# Patient Record
Sex: Male | Born: 1965 | Race: White | Hispanic: No | Marital: Married | Smoking: Never smoker
Health system: Southern US, Community
[De-identification: ages and names within clinical notes are randomized; demographics above are authoritative.]

## PROBLEM LIST (undated history)

## (undated) DIAGNOSIS — E079 Disorder of thyroid, unspecified: Secondary | ICD-10-CM

## (undated) DIAGNOSIS — J189 Pneumonia, unspecified organism: Secondary | ICD-10-CM

## (undated) DIAGNOSIS — E785 Hyperlipidemia, unspecified: Secondary | ICD-10-CM

## (undated) DIAGNOSIS — E039 Hypothyroidism, unspecified: Secondary | ICD-10-CM

## (undated) HISTORY — PX: WISDOM TOOTH EXTRACTION: SHX21

## (undated) HISTORY — PX: FINE NEEDLE ASPIRATION: SHX406

## (undated) HISTORY — PX: BACK SURGERY: SHX140

---
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
## 2001-01-05 ENCOUNTER — Encounter: Payer: Self-pay | Admitting: Emergency Medicine

## 2001-01-05 ENCOUNTER — Emergency Department (HOSPITAL_COMMUNITY): Admission: EM | Admit: 2001-01-05 | Discharge: 2001-01-05 | Payer: Self-pay | Admitting: Emergency Medicine

## 2005-06-10 ENCOUNTER — Encounter: Admission: RE | Admit: 2005-06-10 | Discharge: 2005-06-10 | Payer: Self-pay | Admitting: Family Medicine

## 2009-11-08 ENCOUNTER — Emergency Department (HOSPITAL_COMMUNITY): Admission: EM | Admit: 2009-11-08 | Discharge: 2009-11-08 | Payer: Self-pay | Admitting: Emergency Medicine

## 2015-05-15 DIAGNOSIS — J189 Pneumonia, unspecified organism: Secondary | ICD-10-CM

## 2015-05-15 HISTORY — DX: Pneumonia, unspecified organism: J18.9

## 2015-05-24 ENCOUNTER — Ambulatory Visit
Admission: RE | Admit: 2015-05-24 | Discharge: 2015-05-24 | Disposition: A | Discharge status: Home / Self Care | Payer: BLUE CROSS/BLUE SHIELD | Source: Ambulatory Visit | Attending: Family Medicine | Admitting: Family Medicine

## 2015-05-24 ENCOUNTER — Other Ambulatory Visit: Payer: Self-pay | Admitting: Family Medicine

## 2015-05-24 DIAGNOSIS — R509 Fever, unspecified: Secondary | ICD-10-CM

## 2015-05-24 DIAGNOSIS — R05 Cough: Secondary | ICD-10-CM

## 2015-05-24 DIAGNOSIS — R059 Cough, unspecified: Secondary | ICD-10-CM

## 2015-05-24 DIAGNOSIS — R0602 Shortness of breath: Secondary | ICD-10-CM

## 2015-05-25 ENCOUNTER — Other Ambulatory Visit: Payer: Self-pay | Admitting: Family Medicine

## 2015-05-25 DIAGNOSIS — E049 Nontoxic goiter, unspecified: Secondary | ICD-10-CM

## 2015-05-30 ENCOUNTER — Ambulatory Visit
Admission: RE | Admit: 2015-05-30 | Discharge: 2015-05-30 | Disposition: A | Discharge status: Home / Self Care | Payer: BLUE CROSS/BLUE SHIELD | Source: Ambulatory Visit | Attending: Family Medicine | Admitting: Family Medicine

## 2015-05-30 DIAGNOSIS — E049 Nontoxic goiter, unspecified: Secondary | ICD-10-CM

## 2015-06-14 ENCOUNTER — Other Ambulatory Visit: Payer: Self-pay | Admitting: Family Medicine

## 2015-06-14 ENCOUNTER — Ambulatory Visit
Admission: RE | Admit: 2015-06-14 | Discharge: 2015-06-14 | Disposition: A | Discharge status: Home / Self Care | Payer: BLUE CROSS/BLUE SHIELD | Source: Ambulatory Visit | Attending: Family Medicine | Admitting: Family Medicine

## 2015-06-14 DIAGNOSIS — Z09 Encounter for follow-up examination after completed treatment for conditions other than malignant neoplasm: Secondary | ICD-10-CM

## 2015-06-22 ENCOUNTER — Other Ambulatory Visit (HOSPITAL_COMMUNITY): Payer: Self-pay | Admitting: Surgery

## 2015-06-22 DIAGNOSIS — E042 Nontoxic multinodular goiter: Secondary | ICD-10-CM

## 2015-06-29 ENCOUNTER — Ambulatory Visit (HOSPITAL_COMMUNITY)
Admission: RE | Admit: 2015-06-29 | Discharge: 2015-06-29 | Disposition: A | Discharge status: Home / Self Care | Payer: BLUE CROSS/BLUE SHIELD | Source: Ambulatory Visit | Attending: Surgery | Admitting: Surgery

## 2015-06-29 DIAGNOSIS — E041 Nontoxic single thyroid nodule: Secondary | ICD-10-CM | POA: Diagnosis not present

## 2015-06-29 DIAGNOSIS — E042 Nontoxic multinodular goiter: Secondary | ICD-10-CM

## 2015-06-29 NOTE — Procedures (Signed)
Successful FNA x 4 of isthmus thyroid nodule. No immediate complications, dressing placed. Tsosie Billing PA-C Interventional Radiology  06/29/15  1:43 PM

## 2015-07-13 ENCOUNTER — Other Ambulatory Visit: Payer: Self-pay | Admitting: Surgery

## 2015-07-13 DIAGNOSIS — E042 Nontoxic multinodular goiter: Secondary | ICD-10-CM

## 2015-07-20 ENCOUNTER — Ambulatory Visit
Admission: RE | Admit: 2015-07-20 | Discharge: 2015-07-20 | Disposition: A | Discharge status: Home / Self Care | Payer: BLUE CROSS/BLUE SHIELD | Source: Ambulatory Visit | Attending: Surgery | Admitting: Surgery

## 2015-07-20 DIAGNOSIS — E042 Nontoxic multinodular goiter: Secondary | ICD-10-CM

## 2015-07-20 MED ORDER — IOPAMIDOL (ISOVUE-300) INJECTION 61%
100.0000 mL | Freq: Once | INTRAVENOUS | Status: AC | PRN
  Administered 2015-07-20: 100 mL via INTRAVENOUS

## 2015-07-27 ENCOUNTER — Ambulatory Visit: Payer: Self-pay | Admitting: Surgery

## 2015-08-08 NOTE — Progress Notes (Addendum)
Anesthesia PAT Evaluation: Patient is a 50 year old male scheduled for total thyroidectomy on 08/21/15 (first case) by Dr. Harlow Asa.  Anesthesia consult requested due to goiter with right tracheal deviation. CT scan reviewed with multiple anesthesiologists including Dr. Lissa Hoard. Images reviewed with CT surgery along side Dr. Harlow Asa. Decision was made for CT surgery to be available for this procedure including if rigid bronchoscopy was needed to establish an airway and also if a partial median sternotomy is needed for exposure to remove the entire thyroid. Patient was seen by CT surgeon Dr. Servando Snare while at his PAT visit (see his note).  History includes non-smoker, thyroid goiter, PNA 05/2015, back surgery, dyslipidemia. BMI 34.61. PCP is Dr. Jonathon Jordan.    Meds include ASA 81 mg (on hold), MVI, Zocor.  PAT Vitals: BP 152/92, HR 81, RR 20, T 36.7C, O2 sat 96%. Heart RRR, no murmur noted. Lungs overall clear, somewhat course sounding throughout but no wheezing or rhonchi noted. No ankle edema. No carotid bruits noted. Good mouth opening. Reports crown on right, back upper tooth. Neck is full with thyromegaly, worse on left side.   08/13/15 EKG: NSR, cannot rule out anterior infarct (age undetermined). Low r wave (no Q wave) is new when compared to 11/08/09. He denied chest pain, SOB at rest. He does have exertional dyspnea. He can lie flat but has noticed that he feels more SOB flat. He does not exercise on a regular basis, but is able to do his day to day activities without any CV symptoms. Activity has been more limited since his PNA in 05/2015. He says that within the past six months he was able to do yard work and/or walk up 1-2 flights of stairs without chest pain symptoms. He denied palpitations, syncope, edema.   07/20/15 Neck & Chest CT w/ contrast: FINDINGS: CT NECK FINDINGS - Pharynx is normal with no mucosal lesions. Larynx is normal. Major salivary glands are symmetric and normal  bilaterally. No significant abnormality involving either carotid artery, with minimal focus of calcification on the left. No significant cervical adenopathy. - Known large left thyroid mass measuring 6.8 x 5.1 by 9.5 cm with heterogeneous enhancement and extensive calcification. Mild nodularity and heterogeneity of the isthmus and right thyroid. - Esophagus and thyroid both significantly deviated to the right and narrowed due to mass effect from the left thyroid mass. CT CHEST FINDINGS - Enlarged heterogeneous thyroid mass on the left extends well into the upper mediastinum, terminating posterior to the aortic arch. No significant hilar or mediastinal adenopathy. Largest lymph node measures 9 mm in the precarinal region and maintains a normal hilum. - Normal heart size with mild coronary calcification. No pleural or pericardial effusion. No significant pulmonary parenchymal abnormalities. No acute musculoskeletal findings. - Images through the upper abdomen demonstrate moderate diffuse hepatic steatosis. The liver is not completely imaged. The adrenal glands are not completely imaged but to the degree that the arc imaged appear normal. Very small hiatal hernia noted. IMPRESSION:  Large left thyroid mass (6.8 X 5.1 X 9.5 cm) as described above causing significant mass effect on the trachea and esophagus with right word deviation and narrowing. Thyroid mass is suspicious for carcinoma and is status post biopsy. (FNA 07/02/15: Findings consistent with a benign thyroid nodule.)  06/14/15 CXR: IMPRESSION: 1. Interim near complete clearing of right lower lobe infiltrate. 2. Trachea shifted to the right of midline consistent with previously identified goiter.  Pre-operative labs noted. CMET, PT/PTT, T&S added due to potential for  CT surgery involvement. 06/11/15 thyroid studies (PCP) showed Free T4 0.74, TSH 0.31, thyroglobulin antibody < 1.0, thyroid peroxidase (TPO) Ab 7.   If no acute changes then I  anticipate that he can proceed as planned. Dr. Servando Snare to be available as discussed above.   George Hugh [facility] Short Stay Center/Anesthesiology Phone [phone] 08/13/2015 5:23 PM

## 2015-08-13 ENCOUNTER — Encounter (HOSPITAL_COMMUNITY)
Admission: RE | Admit: 2015-08-13 | Discharge: 2015-08-13 | Disposition: A | Discharge status: Home / Self Care | Payer: BLUE CROSS/BLUE SHIELD | Source: Ambulatory Visit | Attending: Surgery | Admitting: Surgery

## 2015-08-13 ENCOUNTER — Encounter (HOSPITAL_COMMUNITY): Payer: Self-pay

## 2015-08-13 DIAGNOSIS — Z79899 Other long term (current) drug therapy: Secondary | ICD-10-CM | POA: Diagnosis not present

## 2015-08-13 DIAGNOSIS — J398 Other specified diseases of upper respiratory tract: Secondary | ICD-10-CM | POA: Insufficient documentation

## 2015-08-13 DIAGNOSIS — Z0183 Encounter for blood typing: Secondary | ICD-10-CM | POA: Diagnosis not present

## 2015-08-13 DIAGNOSIS — R0602 Shortness of breath: Secondary | ICD-10-CM | POA: Insufficient documentation

## 2015-08-13 DIAGNOSIS — E785 Hyperlipidemia, unspecified: Secondary | ICD-10-CM | POA: Insufficient documentation

## 2015-08-13 DIAGNOSIS — Z01818 Encounter for other preprocedural examination: Secondary | ICD-10-CM | POA: Diagnosis not present

## 2015-08-13 DIAGNOSIS — E049 Nontoxic goiter, unspecified: Secondary | ICD-10-CM | POA: Diagnosis not present

## 2015-08-13 DIAGNOSIS — Z01812 Encounter for preprocedural laboratory examination: Secondary | ICD-10-CM | POA: Diagnosis not present

## 2015-08-13 DIAGNOSIS — E034 Atrophy of thyroid (acquired): Secondary | ICD-10-CM | POA: Diagnosis not present

## 2015-08-13 HISTORY — DX: Pneumonia, unspecified organism: J18.9

## 2015-08-13 LAB — COMPREHENSIVE METABOLIC PANEL
ALT: 32 U/L (ref 17–63)
ANION GAP: 11 (ref 5–15)
AST: 29 U/L (ref 15–41)
Albumin: 4.3 g/dL (ref 3.5–5.0)
Alkaline Phosphatase: 51 U/L (ref 38–126)
BUN: 10 mg/dL (ref 6–20)
CHLORIDE: 107 mmol/L (ref 101–111)
CO2: 24 mmol/L (ref 22–32)
Calcium: 9.7 mg/dL (ref 8.9–10.3)
Creatinine, Ser: 0.91 mg/dL (ref 0.61–1.24)
GFR calc non Af Amer: >60 mL/min (ref >60)
Glucose, Bld: 89 mg/dL (ref 65–99)
Potassium: 4.1 mmol/L (ref 3.5–5.1)
SODIUM: 142 mmol/L (ref 135–145)
Total Bilirubin: 0.5 mg/dL (ref 0.3–1.2)
Total Protein: 7 g/dL (ref 6.5–8.1)

## 2015-08-13 LAB — CBC
HEMATOCRIT: 42.5 % (ref 39.0–52.0)
Hemoglobin: 14.4 g/dL (ref 13.0–17.0)
MCH: 29 pg (ref 26.0–34.0)
MCHC: 33.9 g/dL (ref 30.0–36.0)
MCV: 85.5 fL (ref 78.0–100.0)
PLATELETS: 208 10*3/uL (ref 150–400)
RBC: 4.97 MIL/uL (ref 4.22–5.81)
RDW: 13 % (ref 11.5–15.5)
WBC: 5.1 10*3/uL (ref 4.0–10.5)

## 2015-08-13 LAB — PROTIME-INR
INR: 0.98 (ref 0.00–1.49)
PROTHROMBIN TIME: 13.2 s (ref 11.6–15.2)

## 2015-08-13 LAB — ABO/RH: ABO/RH(D): A POS

## 2015-08-13 LAB — APTT: APTT: 29 s (ref 24–37)

## 2015-08-13 NOTE — Progress Notes (Signed)
Wayne Delacruz was called to evaluate. (patient has dev trachea).

## 2015-08-13 NOTE — Anesthesia Preprocedure Evaluation (Addendum)
Anesthesia Evaluation  Patient identified by MRN, date of birth, ID band Patient awake    Reviewed: Allergy & Precautions, H&P , NPO status , Patient's Chart, lab work & pertinent test results  Airway Mallampati: III  TM Distance: >3 FB Neck ROM: Full    Dental no notable dental hx. (+) Teeth Intact, Dental Advisory Given   Pulmonary shortness of breath and with exertion,    Pulmonary exam normal breath sounds clear to auscultation       Cardiovascular negative cardio ROS   Rhythm:Regular Rate:Normal     Neuro/Psych negative neurological ROS  negative psych ROS   GI/Hepatic negative GI ROS, Neg liver ROS,   Endo/Other  Thyroid mass with L>R tracheal deviation  Renal/GU negative Renal ROS  negative genitourinary   Musculoskeletal   Abdominal   Peds  Hematology negative hematology ROS (+)   Anesthesia Other Findings   Reproductive/Obstetrics negative OB ROS                            Anesthesia Physical Anesthesia Plan  ASA: III  Anesthesia Plan: General   Post-op Pain Management:    Induction: Intravenous  Airway Management Planned: Oral ETT, Video Laryngoscope Planned, Fiberoptic Intubation Planned and Awake Intubation Planned  Additional Equipment:   Intra-op Plan:   Post-operative Plan: Extubation in OR  Informed Consent: I have reviewed the patients History and Physical, chart, labs and discussed the procedure including the risks, benefits and alternatives for the proposed anesthesia with the patient or authorized representative who has indicated his/her understanding and acceptance.   Dental advisory given  Plan Discussed with: CRNA  Anesthesia Plan Comments: (See my anesthesia and CT surgery note. Myra Gianotti, PA-C)       Anesthesia Quick Evaluation

## 2015-08-13 NOTE — Pre-Procedure Instructions (Signed)
AHRON DEGNER  08/13/2015      [id number] [place], [place] [facility] [place] [address] [place] [address] Phone: [phone] Fax: [phone]    Your procedure is scheduled on Tuesday  08/21/15  Report to [facility] Admitting at 530 A.M.  Call this number if you have problems the morning of surgery:  [phone]   Remember:  Do not eat food or drink liquids after midnight.  Take these medicines the morning of surgery with A SIP OF WATER  NONE      (STOP ASPIRIN, MULTIVITAMIN, HERBAL MEDICINES, IBUPROFEN/ ADVIL/ MOTRIN, GOODY POWDERS/ BC'S)   Do not wear jewelry, make-up or nail polish.  Do not wear lotions, powders, or perfumes.  You may wear deodorant.  Do not shave 48 hours prior to surgery.  Men may shave face and neck.  Do not bring valuables to the hospital.  [facility] is not responsible for any belongings or valuables.  Contacts, dentures or bridgework may not be worn into surgery.  Leave your suitcase in the car.  After surgery it may be brought to your room.  For patients admitted to the hospital, discharge time will be determined by your treatment team.  Patients discharged the day of surgery will not be allowed to drive home.   Name and phone number of your driver:    Special instructions:  [place] - Preparing for Surgery  Before surgery, you can play an important role.  Because skin is not sterile, your skin needs to be as free of germs as possible.  You can reduce the number of germs on you skin by washing with CHG (chlorahexidine gluconate) soap before surgery.  CHG is an antiseptic cleaner which kills germs and bonds with the skin to continue killing germs even after washing.  Please DO NOT use if you have an allergy to CHG or antibacterial soaps.  If your skin becomes reddened/irritated stop using the CHG and inform your nurse when you arrive at Short Stay.  Do not shave (including legs and underarms) for at least 48 hours  prior to the first CHG shower.  You may shave your face.  Please follow these instructions carefully:   1.  Shower with CHG Soap the night before surgery and the                                morning of Surgery.  2.  If you choose to wash your hair, wash your hair first as usual with your       normal shampoo.  3.  After you shampoo, rinse your hair and body thoroughly to remove the                      Shampoo.  4.  Use CHG as you would any other liquid soap.  You can apply chg directly       to the skin and wash gently with scrungie or a clean washcloth.  5.  Apply the CHG Soap to your body ONLY FROM THE NECK DOWN.        Do not use on open wounds or open sores.  Avoid contact with your eyes,       ears, mouth and genitals (private parts).  Wash genitals (private parts)       with your normal soap.  6.  Wash thoroughly, paying special  attention to the area where your surgery        will be performed.  7.  Thoroughly rinse your body with warm water from the neck down.  8.  DO NOT shower/wash with your normal soap after using and rinsing off       the CHG Soap.  9.  Pat yourself dry with a clean towel.            10.  Wear clean pajamas.            11.  Place clean sheets on your bed the night of your first shower and do not        sleep with pets.  Day of Surgery  Do not apply any lotions/deoderants the morning of surgery.  Please wear clean clothes to the hospital/surgery center.    Please read over the following fact sheets that you were given. Pain Booklet, Coughing and Deep Breathing and Surgical Site Infection Prevention

## 2015-08-13 NOTE — Progress Notes (Signed)
Patient ID: IROH JESCHKE, male   DOB: 1965/11/08, 50 y.o.   MRN: [id number]      [address]       [place] [address]             [phone]                    Japheth L Dondero [place] Medical Record [id number] Date of Birth: Mar 15, 1966  Referring: Dr Harlow Asa  Primary Care: Lilian Coma, MD  Chief Complaint:   Enlarged Thyroid with tracheal deviation.   History of Present Illness:    CATALINO GURRY 50 y.o. male is seen in short stay  at request of Dr Harlow Asa  today for enlarged thyroid. The patient for the past 6 months had noted some gradual increasing shortness of breath with exertion. He noticed that he would get more short of breath when he got to the top of the stairs. In November 2016 he had some respiratory complaints thought to be pneumonia , chest x-ray was performed. The film revealed evidence of tracheal deviation to the right. Follow-up CT scan of the neck and chest confirmed a markedly enlarged left lobe of the thyroid with substernal extension. Needle biopsy was benign. The patient was seen by Dr. Harlow Asa, thyroidectomy was recommended. I was asked by Dr. Harlow Asa and anesthesia to see the patient and provide thoracic surgery backup at the time of surgery.    Patient's had no shortness of breath at rest, or laying flat. He's had no stridor or wheezing.  Current Activity/ Functional Status:  Patient is independent with mobility/ambulation, transfers, ADL's, IADL's.   Zubrod Score: At the time of surgery this patient's most appropriate activity status/level should be described as: []     0    Normal activity, no symptoms [x]     1    Restricted in physical strenuous activity but ambulatory, able to do out light work []     2    Ambulatory and capable of self care, unable to do work activities, up and about               >50 % of waking hours                              []     3    Only limited self care, in bed greater than 50% of waking hours []      4    Completely disabled, no self care, confined to bed or chair []     5    Moribund   Past Medical History  Diagnosis Date  . Pneumonia     11/16  . Thyroid goiter     mass     Past Surgical History  Procedure Laterality Date  . Back surgery      back  age 64  . Wisdom tooth extraction      History reviewed. No pertinent family history.  Social History   Social History  . Marital Status: Married    Spouse Name: N/A  . Number of Children: N/A  . Years of Education: N/A   Occupational History  . Not on file.   Social History Main Topics  . Smoking status: Never Smoker   . Smokeless tobacco: Not on file  . Alcohol Use: Yes     Comment: occ beer  . Drug Use: No  . Sexual Activity: Not on file  Other Topics Concern  . Not on file   Social History Narrative  . No narrative on file    History  Smoking status  . Never Smoker   Smokeless tobacco  . Not on file    History  Alcohol Use  . Yes    Comment: occ beer     Allergies  Allergen Reactions  . Amoxicillin   . Augmentin [Amoxicillin-Pot Clavulanate]     Current Outpatient Prescriptions  Medication Sig Dispense Refill  . aspirin 81 MG tablet Take 81 mg by mouth daily.    . Multiple Vitamin (MULTIVITAMIN WITH MINERALS) TABS tablet Take 1 tablet by mouth daily.    . simvastatin (ZOCOR) 40 MG tablet Take 40 mg by mouth daily.     No current facility-administered medications for this encounter.      Review of Systems:     Cardiac Review of Systems: Y or N  Chest Pain [   n ]  Resting SOB [   n] Exertional SOB  [ mild ]  Orthopnea [ n ]   Pedal Edema [  n ]    Palpitations [n  ] Syncope  [  n]   Presyncope [  n ]  General Review of Systems: [Y] = yes [  ]=no Constitional: recent weight change [  n];  Wt loss over the last 3 months [   ] anorexia [ n; fatigue [  ]; nausea [  ]; night sweats [  ]; fever [  ]; or chills [  ];          Dental: poor dentition[ n]; Last Dentist visit:   Eye :  blurred vision [  ]; diplopia [   ]; vision changes [  ];  Amaurosis fugax[  ]; Resp: cough [  ];  wheezing[  ];  hemoptysis[  ]; shortness of breath[  ]; paroxysmal nocturnal dyspnea[  ]; dyspnea on exertion[  ]; or orthopnea[  ];  GI:  gallstones[  ], vomiting[  ];  dysphagia[  ]; melena[  ];  hematochezia [  ]; heartburn[  ];   Hx of  Colonoscopy[  ]; GU: kidney stones [  ]; hematuria[  ];   dysuria [  ];  nocturia[  ];  history of     obstruction [  ]; urinary frequency [  ]             Skin: rash, swelling[  ];, hair loss[  ];  peripheral edema[  ];  or itching[  ]; Musculosketetal: myalgias[  ];  joint swelling[  ];  joint erythema[  ];  joint pain[  ];  back pain[  ];  Heme/Lymph: bruising[  ];  bleeding[  ];  anemia[  ];  Neuro: TIA[  ];  headaches[  ];  stroke[  ];  vertigo[  ];  seizures[  ];   paresthesias[  ];  difficulty walking[  ];  Psych:depression[  ]; anxiety[  ];  Endocrine: diabetes[ n;  thyroid dysfunction[as noted  ];  Immunizations: Flu up to date [  ]; Pneumococcal up to date [  ];  Other:  Physical Exam: BP 152/92 mmHg  Pulse 81  Temp(Src) 98.1 F (36.7 C)  Resp 20  Ht 6\' 2"  (1.88 m)  Wt 269 lb 11.2 oz (122.335 kg)  BMI 34.61 kg/m2  SpO2 96%  PHYSICAL EXAMINATION: General appearance: alert, cooperative, appears stated age and no distress Head: Normocephalic, without obvious abnormality, atraumatic Neck: no  adenopathy, no carotid bruit, no JVD, supple, symmetrical, trachea midline and thyroid: enlarged, nodular and on left  Lymph nodes: Cervical, supraclavicular, and axillary nodes normal. Resp: clear to auscultation bilaterally Back: symmetric, no curvature. ROM normal. No CVA tenderness. Cardio: regular rate and rhythm, S1, S2 normal, no murmur, click, rub or gallop GI: soft, non-tender; bowel sounds normal; no masses,  no organomegaly Extremities: extremities normal, atraumatic, no cyanosis or edema and Homans sign is negative, no sign of DVT Neurologic:  Grossly normal  Diagnostic Studies & Laboratory data:     Recent Radiology Findings:   Ct Soft Tissue Neck W Contrast  07/20/2015  CLINICAL DATA:  Multinodular thyroid, check for esophageal deviation, history of pneumonia november 2016 EXAM: CT NECK AND CHEST WITH CONTRAST TECHNIQUE: Multidetector CT imaging of the neck, chest and abdomen was performed using the standard protocol after the bolus administration of intravenous contrast. CONTRAST:  176mL ISOVUE-300 IOPAMIDOL (ISOVUE-300) INJECTION 61% COMPARISON:  06/29/2015, 06/14/2015 FINDINGS: CT NECK FINDINGS Pharynx is normal with no mucosal lesions. Larynx is normal. Major salivary glands are symmetric and normal bilaterally. No significant abnormality involving either carotid artery, with minimal focus of calcification on the left. No significant cervical adenopathy. Known large left thyroid mass measuring 6.8 x 5.1 by 9.5 cm with heterogeneous enhancement and extensive calcification. Mild nodularity and heterogeneity of the isthmus and right thyroid. Esophagus and thyroid both significantly deviated to the right and narrowed due to mass effect from the left thyroid mass. CT CHEST FINDINGS Enlarged heterogeneous thyroid mass on the left extends well into the upper mediastinum, terminating posterior to the aortic arch. No significant hilar or mediastinal adenopathy. Largest lymph node measures 9 mm in the precarinal region and maintains a normal hilum. Normal heart size with mild coronary calcification. No pleural or pericardial effusion. No significant pulmonary parenchymal abnormalities. No acute musculoskeletal findings. Images through the upper abdomen demonstrate moderate diffuse hepatic steatosis. The liver is not completely imaged. The adrenal glands are not completely imaged but to the degree that the arc imaged appear normal. Very small hiatal hernia noted. IMPRESSION: Large left thyroid mass as described above causing significant mass effect on the  trachea and esophagus with right word deviation and narrowing. Thyroid mass is suspicious for carcinoma and is status post biopsy. Electronically Signed   By: Skipper Cliche M.D.   On: 07/20/2015 15:25     I have independently reviewed the above radiologic studies.  Recent Lab Findings: Lab Results  Component Value Date   WBC 5.1 08/13/2015   HGB 14.4 08/13/2015   HCT 42.5 08/13/2015   PLT 208 08/13/2015      Assessment / Plan:   Enlarged left lobe of thyroid with substernal component and deviation of trachea to the right   I have discussed the case with Dr Harlow Asa  and will be present at time of surgery. I have discussed with the patient the rare but definite risk of blood vessel or recurrent nerve injury . The possibility of partial sternotomy was discussed .  I  spent 30 minutes counseling the patient face to face and 50% or more the  time was spent in counseling and coordination of care. The total time spent in the appointment was 40 minutes.  Grace Isaac MD      [address] [place] [address] Office [phone]   Beeper [phone]  08/13/2015 1:27 PM

## 2015-08-20 ENCOUNTER — Encounter (HOSPITAL_COMMUNITY): Payer: Self-pay | Admitting: Surgery

## 2015-08-20 DIAGNOSIS — D440 Neoplasm of uncertain behavior of thyroid gland: Secondary | ICD-10-CM | POA: Diagnosis present

## 2015-08-20 DIAGNOSIS — E049 Nontoxic goiter, unspecified: Secondary | ICD-10-CM | POA: Diagnosis present

## 2015-08-20 MED ORDER — CIPROFLOXACIN IN D5W 400 MG/200ML IV SOLN
400.0000 mg | INTRAVENOUS | Status: AC
  Administered 2015-08-21: 400 mg via INTRAVENOUS
  Filled 2015-08-20: qty 200

## 2015-08-20 NOTE — H&P (Signed)
General Surgery [facility]  Jeffrey L. Jerelene Wayne Delacruz DOB: March 07, 1966 Married / Language: English / Race: White Male  History of Present Illness  The patient is a 50 year old male who presents with a complaint of Enlarged thyroid.  Patient is referred by Dr. Jonathon Jordan for evaluation of multinodular thyroid goiter with tracheal deviation. Patient had an upper respiratory infection this fall which required chest x-ray. He was found on chest x-ray to have pneumonia as well as an incidental finding of significant tracheal deviation. Patient underwent thyroid ultrasound on May 30, 2015. This showed an enlarged right thyroid lobe measuring 5.8 cm which was heterogeneous and contained multiple nodules, the largest of which measured 1.3 cm. The left thyroid lobe was markedly enlarged at 8.6 x 5.0 x 5.4 cm. There were 2 dominant nodules present measuring 7.0 cm and 3.9 cm. Both were solid. In the thyroid isthmus was a nodule measuring 2.2 cm. Biopsy of the 3 dominant nodules was recommended. Patient denies any compressive symptoms. He denies dyspnea or dysphagia. He denies tremors or palpitations. TSH level is mildly suppressed at 0.31. Patient has never been on thyroid medication. He has had no prior head or neck surgery. There is a family history of goiter in the patient's maternal grandmother. The patient's mother does take thyroid medication. There is no family history of other endocrine neoplasms.   Other Problems Back Pain Diverticulosis Hypercholesterolemia  Past Surgical History Spinal Surgery - Lower Back  Diagnostic Studies History Colonoscopy never  Medication History Simvastatin (40MG  Tablet, Oral) Active. Promethazine-Codeine (6.25-10MG /5ML Syrup, Oral) Active. Mucinex Sinus-Max Congestion (5-325-200MG  Tablet, Oral) Active. Multiple Vitamin (Oral) Active. Aspirin (81MG  Tablet, Oral) Active. Excedrin Sinus Headache (5-325MG   Tablet, Oral) Active. Pepcid (20MG  Tablet, Oral) Active. Medications Reconciled  Social History Alcohol use Occasional alcohol use. Caffeine use Coffee. No drug use Tobacco use Never smoker.  Family History Heart Disease Father. Hypertension Father. Thyroid problems Mother.    Review of Systems General Present- Fatigue. Not Present- Appetite Loss, Chills, Fever, Night Sweats, Weight Gain and Weight Loss. Skin Not Present- Change in Wart/Mole, Dryness, Hives, Jaundice, New Lesions, Non-Healing Wounds, Rash and Ulcer. HEENT Present- Hoarseness, Nose Bleed, Ringing in the Ears, Seasonal Allergies and Wears glasses/contact lenses. Not Present- Earache, Hearing Loss, Oral Ulcers, Sinus Pain, Sore Throat, Visual Disturbances and Yellow Eyes. Respiratory Present- Snoring. Not Present- Bloody sputum, Chronic Cough, Difficulty Breathing and Wheezing. Breast Not Present- Breast Mass, Breast Pain, Nipple Discharge and Skin Changes. Cardiovascular Not Present- Chest Pain, Difficulty Breathing Lying Down, Leg Cramps, Palpitations, Rapid Heart Rate, Shortness of Breath and Swelling of Extremities. Gastrointestinal Not Present- Abdominal Pain, Bloating, Bloody Stool, Change in Bowel Habits, Chronic diarrhea, Constipation, Difficulty Swallowing, Excessive gas, Gets full quickly at meals, Hemorrhoids, Indigestion, Nausea, Rectal Pain and Vomiting. Male Genitourinary Not Present- Blood in Urine, Change in Urinary Stream, Frequency, Impotence, Nocturia, Painful Urination, Urgency and Urine Leakage. Musculoskeletal Not Present- Back Pain, Joint Pain, Joint Stiffness, Muscle Pain, Muscle Weakness and Swelling of Extremities. Neurological Not Present- Decreased Memory, Fainting, Headaches, Numbness, Seizures, Tingling, Tremor, Trouble walking and Weakness. Psychiatric Not Present- Anxiety, Bipolar, Change in Sleep Pattern, Depression, Fearful and Frequent crying. Endocrine Not Present- Cold  Intolerance, Excessive Hunger, Hair Changes, Heat Intolerance, Hot flashes and New Diabetes. Hematology Not Present- Easy Bruising, Excessive bleeding, Gland problems, HIV and Persistent Infections.  Vitals Weight: 265 lb Height: 74in Body Surface Area: 2.45 m Body Mass Index: 34.02 kg/m  Temp.: 97.63F  Pulse: 82 (Regular)  BP: 134/68 (Sitting, Left Arm, Standard)   Physical Exam  General - appears comfortable, no distress; not diaphorectic  HEENT - normocephalic; sclerae clear, gaze conjugate; mucous membranes moist, dentition good; voice normal  Neck - asymmetric on extension; no palpable anterior or posterior cervical adenopathy; no palpable nodules in the right thyroid lobe; left thyroid lobe is occupied by dominant mass which is smooth, mobile with swallowing, and nontender; it extends beneath the left clavicle. There is a palpable 2 cm nodule in the isthmus which is mobile, smooth, and nontender.  Chest - clear bilaterally without rhonchi, rales, or wheeze  Cor - regular rhythm with normal rate; no significant murmur  Ext - non-tender without significant edema or lymphedema  Neuro - grossly intact; no tremor    Assessment & Plan  MULTINODULAR THYROID (E04.2) . 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 Written literature on thyroid diseases provided to the patient to review at home.  I reviewed the ultrasound results and laboratory studies with the patient and his wife. We discussed 3 issues which we will need to sort out regarding his thyroid. The first issue is to rule out underlying malignancy. We will obtain an ultrasound-guided fine-needle aspiration biopsy of the 3 dominant nodules in the near future. I will contact him with those results. The second issue then is the anatomy of this enlarged goiter and whether or not it is causing tracheal compression.  We will need to obtain a CT scan of the neck and chest after we have the biopsy results. The final component will be to monitor his borderline hyperthyroidism. He may need a radioactive iodine scan.  CT scan of the neck shows a large 9.5 cm mass in the mediastinum worrisome for malignancy.  I have recommended proceeding with total thyroidectomy.  Patient is in agreement.  The risks and benefits of the procedure have been discussed at length with the patient.  The patient understands the proposed procedure, potential alternative treatments, and the course of recovery to be expected.  All of the patient's questions have been answered at this time.  The patient wishes to proceed with surgery.  Earnstine Regal, MD, [facility] Office: [phone]

## 2015-08-21 ENCOUNTER — Observation Stay (HOSPITAL_COMMUNITY)
Admission: RE | Admit: 2015-08-21 | Discharge: 2015-08-22 | Disposition: A | Discharge status: Home / Self Care | Payer: BLUE CROSS/BLUE SHIELD | Source: Ambulatory Visit | Attending: Surgery | Admitting: Surgery

## 2015-08-21 ENCOUNTER — Ambulatory Visit (HOSPITAL_COMMUNITY): Payer: BLUE CROSS/BLUE SHIELD | Admitting: Certified Registered Nurse Anesthetist

## 2015-08-21 ENCOUNTER — Encounter (HOSPITAL_COMMUNITY): Payer: Self-pay | Admitting: *Deleted

## 2015-08-21 ENCOUNTER — Encounter (HOSPITAL_COMMUNITY)
Admission: RE | Disposition: A | Discharge status: Home / Self Care | Payer: Self-pay | Source: Ambulatory Visit | Attending: Surgery

## 2015-08-21 ENCOUNTER — Ambulatory Visit (HOSPITAL_COMMUNITY): Payer: BLUE CROSS/BLUE SHIELD | Admitting: Vascular Surgery

## 2015-08-21 DIAGNOSIS — E049 Nontoxic goiter, unspecified: Secondary | ICD-10-CM | POA: Diagnosis not present

## 2015-08-21 DIAGNOSIS — E042 Nontoxic multinodular goiter: Secondary | ICD-10-CM | POA: Diagnosis not present

## 2015-08-21 DIAGNOSIS — D440 Neoplasm of uncertain behavior of thyroid gland: Secondary | ICD-10-CM | POA: Diagnosis present

## 2015-08-21 DIAGNOSIS — Z7982 Long term (current) use of aspirin: Secondary | ICD-10-CM | POA: Insufficient documentation

## 2015-08-21 DIAGNOSIS — E0590 Thyrotoxicosis, unspecified without thyrotoxic crisis or storm: Secondary | ICD-10-CM | POA: Diagnosis not present

## 2015-08-21 DIAGNOSIS — E7800 Pure hypercholesterolemia, unspecified: Secondary | ICD-10-CM | POA: Diagnosis not present

## 2015-08-21 DIAGNOSIS — J398 Other specified diseases of upper respiratory tract: Secondary | ICD-10-CM | POA: Diagnosis not present

## 2015-08-21 DIAGNOSIS — Z79899 Other long term (current) drug therapy: Secondary | ICD-10-CM | POA: Diagnosis not present

## 2015-08-21 HISTORY — PX: TOTAL THYROIDECTOMY: SHX2547

## 2015-08-21 HISTORY — DX: Hyperlipidemia, unspecified: E78.5

## 2015-08-21 HISTORY — DX: Disorder of thyroid, unspecified: E07.9

## 2015-08-21 HISTORY — PX: THYROIDECTOMY: SHX17

## 2015-08-21 HISTORY — DX: Hypothyroidism, unspecified: E03.9

## 2015-08-21 LAB — PREPARE RBC (CROSSMATCH)

## 2015-08-21 SURGERY — THYROIDECTOMY
Anesthesia: General | Site: Neck

## 2015-08-21 MED ORDER — HYDROMORPHONE HCL 1 MG/ML IJ SOLN
0.2500 mg | INTRAMUSCULAR | Status: DC | PRN
  Administered 2015-08-21 (×4): 0.5 mg via INTRAVENOUS

## 2015-08-21 MED ORDER — LACTATED RINGERS IV SOLN
INTRAVENOUS | Status: DC | PRN
  Administered 2015-08-21 (×2): via INTRAVENOUS

## 2015-08-21 MED ORDER — 0.9 % SODIUM CHLORIDE (POUR BTL) OPTIME
TOPICAL | Status: DC | PRN
  Administered 2015-08-21 (×2): 1000 mL

## 2015-08-21 MED ORDER — ROCURONIUM BROMIDE 50 MG/5ML IV SOLN
INTRAVENOUS | Status: AC
  Filled 2015-08-21: qty 1

## 2015-08-21 MED ORDER — ONDANSETRON 4 MG PO TBDP: 4.0000 mg | ORAL_TABLET | Freq: Four times a day (QID) | ORAL | Status: DC | PRN

## 2015-08-21 MED ORDER — FENTANYL CITRATE (PF) 100 MCG/2ML IJ SOLN
INTRAMUSCULAR | Status: DC | PRN
  Administered 2015-08-21: 50 ug via INTRAVENOUS
  Administered 2015-08-21: 100 ug via INTRAVENOUS
  Administered 2015-08-21: 50 ug via INTRAVENOUS

## 2015-08-21 MED ORDER — HYDROCODONE-ACETAMINOPHEN 5-325 MG PO TABS
1.0000 | ORAL_TABLET | ORAL | Status: DC | PRN
  Administered 2015-08-21 – 2015-08-22 (×4): 2 via ORAL
  Filled 2015-08-21 (×4): qty 2

## 2015-08-21 MED ORDER — KETAMINE HCL 10 MG/ML IJ SOLN
INTRAMUSCULAR | Status: DC | PRN
  Administered 2015-08-21 (×3): 10 mg via INTRAVENOUS

## 2015-08-21 MED ORDER — ONDANSETRON HCL 4 MG/2ML IJ SOLN
INTRAMUSCULAR | Status: AC
  Filled 2015-08-21: qty 2

## 2015-08-21 MED ORDER — PROPOFOL 10 MG/ML IV BOLUS
INTRAVENOUS | Status: DC | PRN
  Administered 2015-08-21: 100 mg via INTRAVENOUS

## 2015-08-21 MED ORDER — EPHEDRINE SULFATE 50 MG/ML IJ SOLN
INTRAMUSCULAR | Status: DC | PRN
  Administered 2015-08-21: 10 mg via INTRAVENOUS

## 2015-08-21 MED ORDER — HYDROMORPHONE HCL 1 MG/ML IJ SOLN
INTRAMUSCULAR | Status: AC
  Filled 2015-08-21: qty 1

## 2015-08-21 MED ORDER — HEMOSTATIC AGENTS (NO CHARGE) OPTIME
TOPICAL | Status: DC | PRN
  Administered 2015-08-21: 1 via TOPICAL

## 2015-08-21 MED ORDER — PROPOFOL 10 MG/ML IV BOLUS
INTRAVENOUS | Status: AC
  Filled 2015-08-21: qty 20

## 2015-08-21 MED ORDER — EPHEDRINE SULFATE 50 MG/ML IJ SOLN
INTRAMUSCULAR | Status: AC
  Filled 2015-08-21: qty 1

## 2015-08-21 MED ORDER — LIDOCAINE HCL (CARDIAC) 20 MG/ML IV SOLN
INTRAVENOUS | Status: DC | PRN
  Administered 2015-08-21: 50 mg via INTRAVENOUS

## 2015-08-21 MED ORDER — MIDAZOLAM HCL 2 MG/2ML IJ SOLN
INTRAMUSCULAR | Status: AC
  Filled 2015-08-21: qty 2

## 2015-08-21 MED ORDER — LIDOCAINE HCL 4 % EX SOLN
CUTANEOUS | Status: DC | PRN
  Administered 2015-08-21: 4 mL via TOPICAL

## 2015-08-21 MED ORDER — DEXMEDETOMIDINE HCL 200 MCG/2ML IV SOLN
INTRAVENOUS | Status: DC | PRN
  Administered 2015-08-21 (×5): 8 ug via INTRAVENOUS

## 2015-08-21 MED ORDER — MIDAZOLAM HCL 5 MG/5ML IJ SOLN
INTRAMUSCULAR | Status: DC | PRN
  Administered 2015-08-21 (×2): 1 mg via INTRAVENOUS

## 2015-08-21 MED ORDER — KCL IN DEXTROSE-NACL 20-5-0.45 MEQ/L-%-% IV SOLN
INTRAVENOUS | Status: DC
  Administered 2015-08-21: 13:00:00 via INTRAVENOUS
  Filled 2015-08-21: qty 1000

## 2015-08-21 MED ORDER — SUCCINYLCHOLINE CHLORIDE 20 MG/ML IJ SOLN
INTRAMUSCULAR | Status: AC
  Filled 2015-08-21: qty 1

## 2015-08-21 MED ORDER — DEXMEDETOMIDINE HCL IN NACL 200 MCG/50ML IV SOLN
INTRAVENOUS | Status: AC
  Filled 2015-08-21: qty 50

## 2015-08-21 MED ORDER — SUGAMMADEX SODIUM 200 MG/2ML IV SOLN
INTRAVENOUS | Status: DC | PRN
Start: 2015-08-21 — End: 2015-08-21
  Administered 2015-08-21: 200 mg via INTRAVENOUS

## 2015-08-21 MED ORDER — FENTANYL CITRATE (PF) 250 MCG/5ML IJ SOLN
INTRAMUSCULAR | Status: AC
  Filled 2015-08-21: qty 5

## 2015-08-21 MED ORDER — CALCIUM CARBONATE 1250 (500 CA) MG PO TABS
2.0000 | ORAL_TABLET | Freq: Three times a day (TID) | ORAL | Status: DC
  Administered 2015-08-21 – 2015-08-22 (×2): 1000 mg via ORAL
  Filled 2015-08-21 (×2): qty 1

## 2015-08-21 MED ORDER — KETAMINE HCL 100 MG/ML IJ SOLN
INTRAMUSCULAR | Status: AC
  Filled 2015-08-21: qty 1

## 2015-08-21 MED ORDER — LIDOCAINE HCL (CARDIAC) 20 MG/ML IV SOLN
INTRAVENOUS | Status: AC
  Filled 2015-08-21: qty 5

## 2015-08-21 MED ORDER — ONDANSETRON HCL 4 MG/2ML IJ SOLN: 4.0000 mg | Freq: Four times a day (QID) | INTRAMUSCULAR | Status: DC | PRN

## 2015-08-21 MED ORDER — SODIUM CHLORIDE 0.9 % IV SOLN: Freq: Once | INTRAVENOUS | Status: DC

## 2015-08-21 MED ORDER — ROCURONIUM BROMIDE 100 MG/10ML IV SOLN
INTRAVENOUS | Status: DC | PRN
  Administered 2015-08-21: 30 mg via INTRAVENOUS
  Administered 2015-08-21: 40 mg via INTRAVENOUS
  Administered 2015-08-21: 10 mg via INTRAVENOUS
  Administered 2015-08-21: 20 mg via INTRAVENOUS

## 2015-08-21 MED ORDER — DEXAMETHASONE SODIUM PHOSPHATE 10 MG/ML IJ SOLN
INTRAMUSCULAR | Status: DC | PRN
  Administered 2015-08-21: 10 mg via INTRAVENOUS

## 2015-08-21 MED ORDER — PHENYLEPHRINE 40 MCG/ML (10ML) SYRINGE FOR IV PUSH (FOR BLOOD PRESSURE SUPPORT)
PREFILLED_SYRINGE | INTRAVENOUS | Status: AC
  Filled 2015-08-21: qty 10

## 2015-08-21 MED ORDER — ONDANSETRON HCL 4 MG/2ML IJ SOLN
INTRAMUSCULAR | Status: DC | PRN
  Administered 2015-08-21: 4 mg via INTRAVENOUS

## 2015-08-21 MED ORDER — ACETAMINOPHEN 650 MG RE SUPP
650.0000 mg | Freq: Four times a day (QID) | RECTAL | Status: DC | PRN
Start: 2015-08-21 — End: 2015-08-22

## 2015-08-21 MED ORDER — HYDROMORPHONE HCL 1 MG/ML IJ SOLN
1.0000 mg | INTRAMUSCULAR | Status: DC | PRN
  Administered 2015-08-21: 1 mg via INTRAVENOUS
  Filled 2015-08-21: qty 1

## 2015-08-21 MED ORDER — GLYCOPYRROLATE 0.2 MG/ML IJ SOLN
INTRAMUSCULAR | Status: DC | PRN
  Administered 2015-08-21: 0.1 mg via INTRAVENOUS

## 2015-08-21 MED ORDER — PHENYLEPHRINE HCL 10 MG/ML IJ SOLN
10.0000 mg | INTRAVENOUS | Status: DC | PRN
  Administered 2015-08-21: 10 ug/min via INTRAVENOUS

## 2015-08-21 MED ORDER — ACETAMINOPHEN 325 MG PO TABS: 650.0000 mg | ORAL_TABLET | Freq: Four times a day (QID) | ORAL | Status: DC | PRN

## 2015-08-21 SURGICAL SUPPLY — 55 items
ATTRACTOMAT 16X20 MAGNETIC DRP (DRAPES) ×3 IMPLANT
BENZOIN TINCTURE PRP APPL 2/3 (GAUZE/BANDAGES/DRESSINGS) ×3 IMPLANT
BLADE SURG 10 STRL SS (BLADE) ×3 IMPLANT
BLADE SURG 15 STRL LF DISP TIS (BLADE) ×2 IMPLANT
BLADE SURG 15 STRL SS (BLADE) ×1
BLADE SURG ROTATE 9660 (MISCELLANEOUS) IMPLANT
CANISTER SUCTION 2500CC (MISCELLANEOUS) ×3 IMPLANT
CHLORAPREP W/TINT 10.5 ML (MISCELLANEOUS) IMPLANT
CHLORAPREP W/TINT 26ML (MISCELLANEOUS) ×3 IMPLANT
CLIP TI MEDIUM 24 (CLIP) ×6 IMPLANT
CLIP TI WIDE RED SMALL 24 (CLIP) ×6 IMPLANT
CLOSURE STERI-STRIP 1/4X4 (GAUZE/BANDAGES/DRESSINGS) ×3 IMPLANT
CONT SPEC 4OZ CLIKSEAL STRL BL (MISCELLANEOUS) ×3 IMPLANT
COVER SURGICAL LIGHT HANDLE (MISCELLANEOUS) ×3 IMPLANT
CRADLE DONUT ADULT HEAD (MISCELLANEOUS) ×3 IMPLANT
DRAPE LAPAROTOMY T 98X78 PEDS (DRAPES) ×3 IMPLANT
DRAPE UTILITY 15X26 TOWEL STRL (DRAPES) ×3 IMPLANT
DRAPE UTILITY XL STRL (DRAPES) ×3 IMPLANT
ELECT CAUTERY BLADE 6.4 (BLADE) ×3 IMPLANT
ELECT REM PT RETURN 9FT ADLT (ELECTROSURGICAL) ×3
ELECTRODE REM PT RTRN 9FT ADLT (ELECTROSURGICAL) ×2 IMPLANT
GAUZE SPONGE 4X4 12PLY STRL (GAUZE/BANDAGES/DRESSINGS) ×3 IMPLANT
GAUZE SPONGE 4X4 16PLY XRAY LF (GAUZE/BANDAGES/DRESSINGS) ×6 IMPLANT
GLOVE BIO SURGEON STRL SZ 6.5 (GLOVE) ×9 IMPLANT
GLOVE BIO SURGEON STRL SZ7 (GLOVE) ×3 IMPLANT
GLOVE BIOGEL PI IND STRL 8.5 (GLOVE) ×2 IMPLANT
GLOVE BIOGEL PI INDICATOR 8.5 (GLOVE) ×1
GLOVE SURG ORTHO 8.0 STRL STRW (GLOVE) ×3 IMPLANT
GOWN STRL REUS W/ TWL LRG LVL3 (GOWN DISPOSABLE) ×4 IMPLANT
GOWN STRL REUS W/ TWL XL LVL3 (GOWN DISPOSABLE) ×2 IMPLANT
GOWN STRL REUS W/TWL LRG LVL3 (GOWN DISPOSABLE) ×2
GOWN STRL REUS W/TWL XL LVL3 (GOWN DISPOSABLE) ×1
HEMOSTAT SURGICEL 2X4 FIBR (HEMOSTASIS) ×3 IMPLANT
KIT BASIN OR (CUSTOM PROCEDURE TRAY) ×3 IMPLANT
KIT CLEAN ENDO COMPLIANCE (KITS) ×3 IMPLANT
KIT ROOM TURNOVER OR (KITS) ×3 IMPLANT
NS IRRIG 1000ML POUR BTL (IV SOLUTION) ×3 IMPLANT
OIL SILICONE PENTAX (PARTS (SERVICE/REPAIRS)) ×3 IMPLANT
PACK SURGICAL SETUP 50X90 (CUSTOM PROCEDURE TRAY) ×3 IMPLANT
PAD ARMBOARD 7.5X6 YLW CONV (MISCELLANEOUS) ×3 IMPLANT
PENCIL BUTTON HOLSTER BLD 10FT (ELECTRODE) ×3 IMPLANT
SHEARS HARMONIC 9CM CVD (BLADE) ×3 IMPLANT
SPECIMEN JAR MEDIUM (MISCELLANEOUS) IMPLANT
SPONGE INTESTINAL PEANUT (DISPOSABLE) ×3 IMPLANT
STRIP CLOSURE SKIN 1/2X4 (GAUZE/BANDAGES/DRESSINGS) ×3 IMPLANT
SUT MNCRL AB 4-0 PS2 18 (SUTURE) ×3 IMPLANT
SUT SILK 2 0 (SUTURE) ×1
SUT SILK 2-0 18XBRD TIE 12 (SUTURE) ×2 IMPLANT
SUT VIC AB 3-0 SH 18 (SUTURE) ×3 IMPLANT
SYR 30ML SLIP (SYRINGE) ×3 IMPLANT
SYR BULB 3OZ (MISCELLANEOUS) ×3 IMPLANT
TOWEL OR 17X24 6PK STRL BLUE (TOWEL DISPOSABLE) ×3 IMPLANT
TOWEL OR 17X26 10 PK STRL BLUE (TOWEL DISPOSABLE) ×3 IMPLANT
TRAP SPECIMEN MUCOUS 40CC (MISCELLANEOUS) ×3 IMPLANT
TUBE CONNECTING 12X1/4 (SUCTIONS) ×9 IMPLANT

## 2015-08-21 NOTE — Interval H&P Note (Signed)
History and Physical Interval Note:  08/21/2015 7:04 AM  Wayne Delacruz  has presented today for surgery, with the diagnosis of Thyroid goiter w/ tracheal compression substernal goiter.  The various methods of treatment have been discussed with the patient and family. After consideration of risks, benefits and other options for treatment, the patient has consented to    Procedure(s): TOTAL THYROIDECTOMY (N/A) RIGID BRONCHOSCOPY (N/A) as a surgical intervention .    The patient's history has been reviewed, patient examined, no change in status, stable for surgery.  I have reviewed the patient's chart and labs.  Questions were answered to the patient's satisfaction.    Earnstine Regal, MD, [facility] Office: [place]

## 2015-08-21 NOTE — Op Note (Signed)
Procedure Note  Pre-operative Diagnosis:  Substernal thyroid goiter with tracheal compression  Post-operative Diagnosis:  same  Surgeon:  Earnstine Regal, MD, FACS  Assistant:  Lanelle Bal, MD, FACS   Procedure:  Total thyroidectomy  Anesthesia:  General  Estimated Blood Loss:  <100cc  Drains: none         Specimen: thyroid to pathology  Indications:  Patient is referred by Dr. Jonathon Jordan for evaluation of multinodular thyroid goiter with tracheal deviation. Patient had an upper respiratory infection this fall which required chest x-ray. He was found on chest x-ray to have pneumonia as well as an incidental finding of significant tracheal deviation. Patient underwent thyroid ultrasound on May 30, 2015. This showed an enlarged right thyroid lobe measuring 5.8 cm which was heterogeneous and contained multiple nodules, the largest of which measured 1.3 cm. The left thyroid lobe was markedly enlarged at 8.6 x 5.0 x 5.4 cm. There were 2 dominant nodules present measuring 7.0 cm and 3.9 cm. Both were solid. In the thyroid isthmus was a nodule measuring 2.2 cm. Biopsy of the 3 dominant nodules was recommended. Patient denies any compressive symptoms.   Procedure Details: Procedure was done in OR #14 at the [place].  The patient was brought to the operating room and placed in a supine position on the operating room table.  Following administration of general anesthesia per Dr. Suann Larry, the patient was positioned and then prepped and draped in the usual aseptic fashion.  After ascertaining that an adequate level of anesthesia had been achieved, a Kocher incision was made with #15 blade.  Dissection was carried through subcutaneous tissues and platysma. Hemostasis was achieved with the electrocautery.  Skin flaps were elevated cephalad and caudad from the thyroid notch to the sternal notch.  The Mahorner self-retaining retractor was placed for  exposure.  Strap muscles were incised in the midline and dissection was begun on the left side.  Strap muscles were reflected laterally.  Left thyroid lobe was markedly enlarged and multinodular.  There was a very large substernal component which extended into the mediastinum and deviated the trachea significantly to the right.  The left lobe was gently mobilized with blunt dissection.  Superior pole vessels were dissected out and divided individually between small and medium Ligaclips with the Harmonic scalpel.  The thyroid lobe was rolled anteriorly.  Branches of the inferior thyroid artery were divided between small Ligaclips with the Harmonic scalpel.  Inferior venous tributaries were divided between Ligaclips. The ligament of Gwenlyn Found was released with the electrocautery and the trachea identified.  With gentle dissection, the retrosternal thyroid mass was mobilized and venous tributaries divided with the Harmonic between ligaclips.  Dissection was carried deep into the upper mediastinum.  There was a large (approx 5 cm) portion of the thyroid mass which was very firm and apparently calcified.  It was densely adherent to surrounding structures. It was mobilized with blunt dissection and with division of tissues on the surface of the thyroid using the Harmonic scalpel.  This allowed for eventual complete mobilization of the thyroid mass out of the chest.  Isthmus was mobilized across the midline.  There was no pyramidal lobe present.  Dry packs were placed in the left neck.  Next, the right thyroid lobe was gently mobilized with blunt dissection.  Right thyroid lobe was normal in size but contained multiple nodules.  Superior pole vessels were dissected out and divided between small and medium Ligaclips with the Harmonic  scalpel.  Superior parathyroid was identified and preserved.  Inferior venous tributaries were divided between medium Ligaclips with the Harmonic scalpel.  The right thyroid lobe was rolled  anteriorly and the branches of the inferior thyroid artery divided between small Ligaclips.  The right recurrent laryngeal nerve was identified and preserved along its course.  The ligament of Gwenlyn Found was released with the electrocautery.  The right thyroid lobe was mobilized onto the anterior trachea and the remainder of the thyroid was dissected off the anterior trachea and the thyroid was completely excised. The entire thyroid gland was submitted to pathology for review.  The neck was irrigated with warm saline.  Fibrillar was placed throughout the operative field.  Strap muscles were reapproximated in the midline with interrupted 3-0 Vicryl sutures.  Platysma was closed with interrupted 3-0 Vicryl sutures.  Skin was closed with a running 4-0 Monocryl subcuticular suture.  Wound was washed and dried and benzoin and steri-strips were applied.  Dry gauze dressing was placed.  The patient was awakened from anesthesia and brought to the recovery room.  The patient tolerated the procedure well.   Earnstine Regal, MD, [facility] Office: [phone]

## 2015-08-21 NOTE — Anesthesia Postprocedure Evaluation (Signed)
Anesthesia Post Note  Patient: Wayne Delacruz  Procedure(s) Performed: Procedure(s) (LRB): TOTAL THYROIDECTOMY (N/A)  Patient location during evaluation: PACU Anesthesia Type: General Level of consciousness: awake and alert Pain management: pain level controlled Vital Signs Assessment: post-procedure vital signs reviewed and stable Respiratory status: spontaneous breathing, nonlabored ventilation, respiratory function stable and patient connected to nasal cannula oxygen Cardiovascular status: blood pressure returned to baseline and stable Postop Assessment: no signs of nausea or vomiting Anesthetic complications: no    Last Vitals:  Filed Vitals:   08/21/15 1215 08/21/15 1223  BP:  123/79  Pulse: 63 77  Temp:  36.1 C  Resp: 10 18    Last Pain:  Filed Vitals:   08/21/15 1225  PainSc: 4                  Yaziel Brandon,W. EDMOND

## 2015-08-21 NOTE — Anesthesia Procedure Notes (Addendum)
Procedure Name: Awake intubation Date/Time: 08/21/2015 7:49 AM Performed by: Rejeana Brock L Pre-anesthesia Checklist: Patient identified, Emergency Drugs available, Suction available, Patient being monitored and Timeout performed Patient Re-evaluated:Patient Re-evaluated prior to inductionOxygen Delivery Method: Circle system utilized Preoxygenation: Pre-oxygenation with 100% oxygen (awake intubation with glidescope. oral pharynx with pledgets of lidocaine. Ketamine, versed and precedex given IV) Laryngoscope Size: Glidescope and 4 Grade View: Grade I Tube type: Oral Tube size: 8.0 mm Number of attempts: 2 Airway Equipment and Method: Stylet,  LTA kit utilized and Video-laryngoscopy Placement Confirmation: ETT inserted through vocal cords under direct vision,  positive ETCO2 and breath sounds checked- equal and bilateral Secured at: 23 cm Tube secured with: Tape Dental Injury: Teeth and Oropharynx as per pre-operative assessment  Difficulty Due To: Difficulty was anticipated

## 2015-08-21 NOTE — H&P (Signed)
Patient ID: [name], male   DOB: [birth year], 50 y.o.   MRN: [id number]      Wayne Delacruz       Wayne Delacruz [id number]             [phone]                    Wayne Delacruz Wayne Delacruz [id number] Date of Birth: 04-29-1966  Referring: Dr Harlow Asa  Primary Care: Wayne Coma, MD  Chief Complaint:   Enlarged Thyroid with tracheal deviation.   History of Present Illness:    Wayne Delacruz 50 y.o. male is seen in short stay  at request of Dr Harlow Asa for enlarged thyroid. The patient for the past 6 months had noted some gradual increasing shortness of breath with exertion. He noticed that he would get more short of breath when he got to the top of the stairs. In November 2016 he had some respiratory complaints thought to be pneumonia , chest x-ray was performed. The film revealed evidence of tracheal deviation to the right. Follow-up CT scan of the neck and chest confirmed a markedly enlarged left lobe of the thyroid with substernal extension. Needle biopsy was benign. The patient was seen by Dr. Harlow Asa, thyroidectomy was recommended. I was asked by Dr. Harlow Asa and anesthesia to see the patient and provide thoracic surgery backup at the time of surgery.    Patient's had no shortness of breath at rest, or laying flat. He's had no stridor or wheezing.  Current Activity/ Functional Status:  Patient is independent with mobility/ambulation, transfers, ADL's, IADL's.   Zubrod Score: At the time of surgery this patient's most appropriate activity status/level should be described as: []     0    Normal activity, no symptoms [x]     1    Restricted in physical strenuous activity but ambulatory, able to do out light work []     2    Ambulatory and capable of self care, unable to do work activities, up and about               >50 % of waking hours                              []     3    Only limited self care, in bed greater than 50% of waking hours []     4     Completely disabled, no self care, confined to bed or chair []     5    Moribund   Past Medical History  Diagnosis Date  . Pneumonia     11/16  . Thyroid goiter     mass     Past Surgical History  Procedure Laterality Date  . Back surgery      back  age 15  . Wisdom tooth extraction      History reviewed. No pertinent family history.  Social History   Social History  . Marital Status: Married    Spouse Name: N/A  . Number of Children: N/A  . Years of Education: N/A   Occupational History  . Not on file.   Social History Main Topics  . Smoking status: Never Smoker   . Smokeless tobacco: Not on file  . Alcohol Use: Yes     Comment: occ beer  . Drug Use: No  . Sexual Activity: Not on file  Other Topics Concern  . Not on file   Social History Narrative    History  Smoking status  . Never Smoker   Smokeless tobacco  . Not on file    History  Alcohol Use  . Yes    Comment: occ beer     Allergies  Allergen Reactions  . Amoxicillin Rash  . Augmentin [Amoxicillin-Pot Clavulanate] Rash    Current Facility-Administered Medications  Medication Dose Route Frequency Provider Last Rate Last Dose  . ciprofloxacin (CIPRO) IVPB 400 mg  400 mg Intravenous To SS-Surg Wayne Gemma, MD          Review of Systems:     Cardiac Review of Systems: Y or N  Chest Pain [   n ]  Resting SOB [   n] Exertional SOB  [ mild ]  Orthopnea [ n ]   Pedal Edema [  n ]    Palpitations [n  ] Syncope  [  n]   Presyncope [  n ]  General Review of Systems: [Y] = yes [  ]=no Constitional: recent weight change [  n];  Wt loss over the last 3 months [   ] anorexia [ n; fatigue [  ]; nausea [  ]; night sweats [  ]; fever [  ]; or chills [  ];          Dental: poor dentition[ n]; Last Dentist visit:   Eye : blurred vision [  ]; diplopia [   ]; vision changes [  ];  Amaurosis fugax[  ]; Resp: cough [  ];  wheezing[  ];  hemoptysis[  ]; shortness of breath[  ]; paroxysmal nocturnal  dyspnea[  ]; dyspnea on exertion[  ]; or orthopnea[  ];  GI:  gallstones[  ], vomiting[  ];  dysphagia[  ]; melena[  ];  hematochezia [  ]; heartburn[  ];   Hx of  Colonoscopy[  ]; GU: kidney stones [  ]; hematuria[  ];   dysuria [  ];  nocturia[  ];  history of     obstruction [  ]; urinary frequency [  ]             Skin: rash, swelling[  ];, hair loss[  ];  peripheral edema[  ];  or itching[  ]; Musculosketetal: myalgias[  ];  joint swelling[  ];  joint erythema[  ];  joint pain[  ];  back pain[  ];  Heme/Lymph: bruising[  ];  bleeding[  ];  anemia[  ];  Neuro: TIA[  ];  headaches[  ];  stroke[  ];  vertigo[  ];  seizures[  ];   paresthesias[  ];  difficulty walking[  ];  Psych:depression[  ]; anxiety[  ];  Endocrine: diabetes[ n;  thyroid dysfunction[as noted  ];  Immunizations: Flu up to date [  ]; Pneumococcal up to date [  ];  Other:  Physical Exam: BP 139/76 mmHg  Pulse 77  Temp(Src) 98.2 F (36.8 C) (Oral)  Ht 6\' 2"  (1.88 m)  Wt 265 lb (120.203 kg)  BMI 34.01 kg/m2  SpO2 97%  PHYSICAL EXAMINATION: General appearance: alert, cooperative, appears stated age and no distress Head: Normocephalic, without obvious abnormality, atraumatic Neck: no adenopathy, no carotid bruit, no JVD, supple, symmetrical, trachea midline and thyroid: enlarged, nodular and on left  Lymph nodes: Cervical, supraclavicular, and axillary nodes normal. Resp: clear to auscultation bilaterally Back: symmetric, no curvature. ROM normal. No CVA tenderness.  Cardio: regular rate and rhythm, S1, S2 normal, no murmur, click, rub or gallop GI: soft, non-tender; bowel sounds normal; no masses,  no organomegaly Extremities: extremities normal, atraumatic, no cyanosis or edema and Homans sign is negative, no sign of DVT Neurologic: Grossly normal  Diagnostic Studies & Laboratory data:     Recent Radiology Findings:   Ct Soft Tissue Neck W Contrast  07/20/2015  CLINICAL DATA:  Multinodular thyroid, check for  esophageal deviation, history of pneumonia november 2016 EXAM: CT NECK AND CHEST WITH CONTRAST TECHNIQUE: Multidetector CT imaging of the neck, chest and abdomen was performed using the standard protocol after the bolus administration of intravenous contrast. CONTRAST:  112mL ISOVUE-300 IOPAMIDOL (ISOVUE-300) INJECTION 61% COMPARISON:  06/29/2015, 06/14/2015 FINDINGS: CT NECK FINDINGS Pharynx is normal with no mucosal lesions. Larynx is normal. Major salivary glands are symmetric and normal bilaterally. No significant abnormality involving either carotid artery, with minimal focus of calcification on the left. No significant cervical adenopathy. Known large left thyroid mass measuring 6.8 x 5.1 by 9.5 cm with heterogeneous enhancement and extensive calcification. Mild nodularity and heterogeneity of the isthmus and right thyroid. Esophagus and thyroid both significantly deviated to the right and narrowed due to mass effect from the left thyroid mass. CT CHEST FINDINGS Enlarged heterogeneous thyroid mass on the left extends well into the upper mediastinum, terminating posterior to the aortic arch. No significant hilar or mediastinal adenopathy. Largest lymph node measures 9 mm in the precarinal region and maintains a normal hilum. Normal heart size with mild coronary calcification. No pleural or pericardial effusion. No significant pulmonary parenchymal abnormalities. No acute musculoskeletal findings. Images through the upper abdomen demonstrate moderate diffuse hepatic steatosis. The liver is not completely imaged. The adrenal glands are not completely imaged but to the degree that the arc imaged appear normal. Very small hiatal hernia noted. IMPRESSION: Large left thyroid mass as described above causing significant mass effect on the trachea and esophagus with right word deviation and narrowing. Thyroid mass is suspicious for carcinoma and is status post biopsy. Electronically Signed   By: Skipper Cliche M.D.    On: 07/20/2015 15:25     I have independently reviewed the above radiologic studies.  Recent Lab Findings: Lab Results  Component Value Date   WBC 5.1 08/13/2015   HGB 14.4 08/13/2015   HCT 42.5 08/13/2015   PLT 208 08/13/2015   GLUCOSE 89 08/13/2015   ALT 32 08/13/2015   AST 29 08/13/2015   NA 142 08/13/2015   K 4.1 08/13/2015   CL 107 08/13/2015   CREATININE 0.91 08/13/2015   BUN 10 08/13/2015   CO2 24 08/13/2015   INR 0.98 08/13/2015      Assessment / Plan:   Enlarged left lobe of thyroid with substernal component and deviation of trachea to the right   I have discussed the case with Dr Harlow Asa  and will be present at time of surgery. I have discussed with the patient the rare but definite risk of blood vessel or recurrent nerve injury . The possibility of partial sternotomy was discussed .   Grace Isaac MD      [address] [place] [address] Office [phone]   Beeper [phone]  08/21/2015 7:20 AM

## 2015-08-21 NOTE — Transfer of Care (Signed)
Immediate Anesthesia Transfer of Care Note  Patient: Wayne Delacruz  Procedure(s) Performed: Procedure(s): TOTAL THYROIDECTOMY (N/A)  Patient Location: PACU  Anesthesia Type:General  Level of Consciousness: awake  Airway & Oxygen Therapy: Patient Spontanous Breathing and Patient connected to nasal cannula oxygen  Post-op Assessment: Report given to RN and Post -op Vital signs reviewed and stable  Post vital signs: stable  Last Vitals:  Filed Vitals:   08/21/15 1117 08/21/15 1123  BP:  119/74  Pulse: 87   Temp:    Resp: 18     Complications: No apparent anesthesia complications

## 2015-08-22 ENCOUNTER — Encounter (HOSPITAL_COMMUNITY): Payer: Self-pay | Admitting: Surgery

## 2015-08-22 DIAGNOSIS — E049 Nontoxic goiter, unspecified: Secondary | ICD-10-CM | POA: Diagnosis not present

## 2015-08-22 LAB — TYPE AND SCREEN
ABO/RH(D): A POS
Antibody Screen: NEGATIVE
UNIT DIVISION: 0
Unit division: 0

## 2015-08-22 LAB — BASIC METABOLIC PANEL
Anion gap: 9 (ref 5–15)
BUN: 9 mg/dL (ref 6–20)
CHLORIDE: 103 mmol/L (ref 101–111)
CO2: 27 mmol/L (ref 22–32)
Calcium: 9.3 mg/dL (ref 8.9–10.3)
Creatinine, Ser: 0.79 mg/dL (ref 0.61–1.24)
GFR calc Af Amer: >60 mL/min (ref >60)
GFR calc non Af Amer: >60 mL/min (ref >60)
GLUCOSE: 116 mg/dL — AB (ref 65–99)
Potassium: 4.2 mmol/L (ref 3.5–5.1)
Sodium: 139 mmol/L (ref 135–145)

## 2015-08-22 MED ORDER — SYNTHROID 100 MCG PO TABS: 100.0000 ug | ORAL_TABLET | Freq: Every day | ORAL | Status: AC

## 2015-08-22 MED ORDER — CALCIUM CARBONATE 1250 (500 CA) MG PO TABS: 2.0000 | ORAL_TABLET | Freq: Two times a day (BID) | ORAL | Status: AC

## 2015-08-22 MED ORDER — HYDROCODONE-ACETAMINOPHEN 5-325 MG PO TABS: 1.0000 | ORAL_TABLET | ORAL | Status: AC | PRN

## 2015-08-22 NOTE — Addendum Note (Signed)
Addendum  created 08/22/15 0646 by Inda Coke, CRNA   Modules edited: Anesthesia Events, Narrator   Narrator:  Narrator: Event Log Edited

## 2015-08-22 NOTE — Discharge Summary (Signed)
Physician Discharge Summary [facility]  Patient ID: Wayne Delacruz MRN: [id number] DOB/AGE: 1965/08/26 50 y.o.  Admit date: 08/21/2015 Discharge date: 08/22/2015  Admission Diagnoses:  Substernal thyroid goiter, thyroid neoplasm of uncertain behavior  Discharge Diagnoses:  Principal Problem:   Substernal thyroid goiter Active Problems:   Neoplasm of uncertain behavior of thyroid gland   Discharged Condition: good  Hospital Course: Patient was admitted for observation following thyroid surgery.  Post op course was uncomplicated.  Pain was well controlled.  Tolerated diet.  Post op calcium level on morning following surgery was 9.3 mg/dl.  Patient was prepared for discharge home on POD#1.  Consults: Dr. Ceasar Mons, Cardiothoracic surgery  Treatments: surgery: total thyroidectomy for substernal goiter  Discharge Exam: Blood pressure 117/59, pulse 93, temperature 97.6 F (36.4 C), temperature source Oral, resp. rate 16, height 6\' 2"  (1.88 m), weight 126.1 kg (278 lb), SpO2 93 %. HEENT - clear Neck - wound dry and intact, mild STS; voice essentially normal Chest - clear bilaterally Cor - RRR  Disposition: Home  Discharge Instructions    Apply dressing    Complete by:  As directed   Apply light gauze dressing to wound before discharge home today.     Diet - low sodium heart healthy    Complete by:  As directed      Discharge instructions    Complete by:  As directed   [facility]  THYROID & PARATHYROID SURGERY:  POST-OP INSTRUCTIONS  Always review your discharge instruction sheet from the facility where your surgery was performed.  A prescription for pain medication may be given to you upon discharge.  Take your pain medication as prescribed.  If narcotic pain medicine is not needed, then you may take acetaminophen (Tylenol) or ibuprofen (Advil) as needed.  Take your usually prescribed medications unless otherwise directed.  If you  need a refill on your pain medication, please contact your pharmacy. They will contact our office to request authorization.  Prescriptions will not be processed by our office after 5 pm or on weekends.  Start with a light diet upon arrival home, such as soup and crackers or toast.  Be sure to drink plenty of fluids daily.  Resume your normal diet the day after surgery.  Most patients will experience some swelling and bruising on the chest and neck area.  Ice packs will help.  Swelling and bruising can take several days to resolve.   It is common to experience some constipation after surgery.  Increasing fluid intake and taking a stool softener will usually help or prevent this problem.  A mild laxative (Milk of Magnesia or Miralax) should be taken according to package directions if there has been no bowel movement after 48 hours.  You have steri-strips and a gauze dressing over your incision.  You may remove the gauze bandage on the second day after surgery, and you may shower at that time.  Leave your steri-strips (small skin tapes) in place directly over the incision.  These strips should remain on the skin for 5-7 days and then be removed.  You may get them wet in the shower and pat them dry.  You may resume regular (light) daily activities beginning the next day - such as daily self-care, walking, climbing stairs - gradually increasing activities as tolerated.  You may have sexual intercourse when it is comfortable.  Refrain from any heavy lifting or straining until approved by your doctor.  You may  drive when you no longer are taking prescription pain medication, you can comfortably wear a seatbelt, and you can safely maneuver your car and apply brakes.  You should see your doctor in the office for a follow-up appointment approximately two to three weeks after your surgery.  Make sure that you call for this appointment within a day or two after you arrive home to insure a convenient appointment  time.  WHEN TO CALL YOUR DOCTOR: -- Fever greater than 101.5 -- Inability to urinate -- Nausea and/or vomiting - persistent -- Extreme swelling or bruising -- Continued bleeding from incision -- Increased pain, redness, or drainage from the incision -- Difficulty swallowing or breathing -- Muscle cramping or spasms -- Numbness or tingling in hands or around lips  The clinic staff is available to answer your questions during regular business hours.  Please don't hesitate to call and ask to speak to one of the nurses if you have concerns.  Earnstine Regal, MD, [facility] Office: [phone]  Website: [email or web address]     Ice pack    Complete by:  As directed      Increase activity slowly    Complete by:  As directed      Remove dressing in 24 hours    Complete by:  As directed             Medication List    TAKE these medications        aspirin 81 MG tablet  Take 81 mg by mouth daily.     calcium carbonate 1250 (500 Ca) MG tablet  Commonly known as:  OS-CAL - dosed in mg of elemental calcium  Take 2 tablets (1,000 mg of elemental calcium total) by mouth 2 (two) times daily with a meal.     HYDROcodone-acetaminophen 5-325 MG tablet  Commonly known as:  NORCO/VICODIN  Take 1-2 tablets by mouth every 4 (four) hours as needed for moderate pain.     multivitamin with minerals Tabs tablet  Take 1 tablet by mouth daily.     simvastatin 40 MG tablet  Commonly known as:  ZOCOR  Take 40 mg by mouth daily.     SYNTHROID 100 MCG tablet  Generic drug:  levothyroxine  Take 1 tablet (100 mcg total) by mouth daily.           Follow-up Information    Follow up with Earnstine Regal, MD. Schedule an appointment as soon as possible for a visit in 3 weeks.   Specialty:  General Surgery   Why:  For wound re-check   Contact information:   [place] [address] [phone]        Earnstine Regal, MD, [facility] Office: [phone]   Signed: Earnstine Regal 08/22/2015, 7:57 AM

## 2015-08-23 NOTE — Progress Notes (Signed)
Quick Note:  Path is benign. Wayne Delacruz - please notify patient.  Earnstine Regal, MD, [facility] Office: [phone]   ______

## 2016-12-14 IMAGING — CT CT CHEST W/ CM
1 series · 15 of 33 positions shown, 19 images · IV contrast (APPLIED)
Comparison: 06/29/2015, 06/14/2015

CLINICAL DATA: Multinodular thyroid, check for esophageal
deviation, history of pneumonia May 2015

EXAM:
CT NECK AND CHEST WITH CONTRAST
TECHNIQUE: Multidetector CT imaging of the neck, chest and abdomen was
performed using the standard protocol after the bolus administration
of intravenous contrast.
CONTRAST:  100mL U7TOEC-I22 IOPAMIDOL (U7TOEC-I22) INJECTION 61%

[Series 4: chest · axial · 0.98mm/px · z∈[+274,+539]mm · 15 of 63 slices shown, 19 images]
[im 5/63  mediastinal]
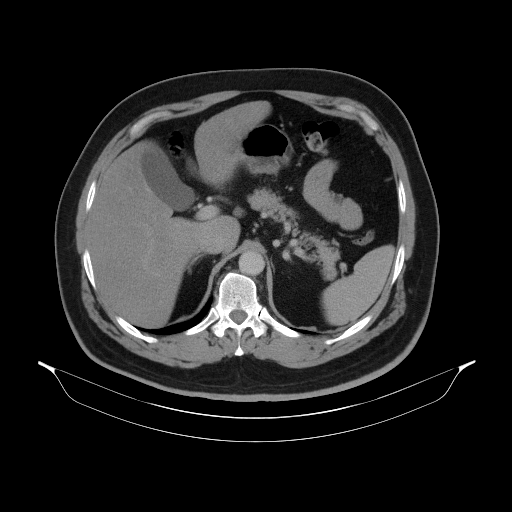
[im 5/63  lung]
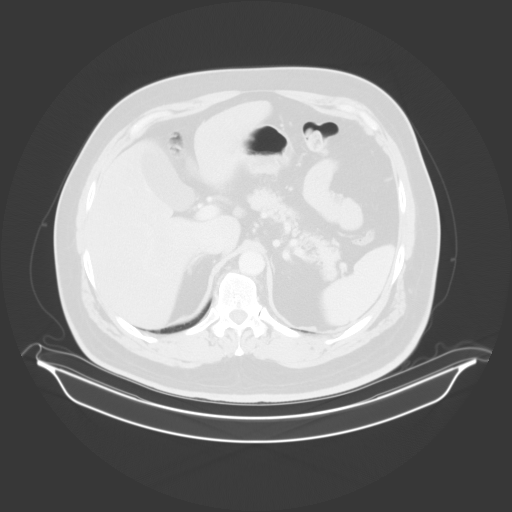
[im 10/63  lung]
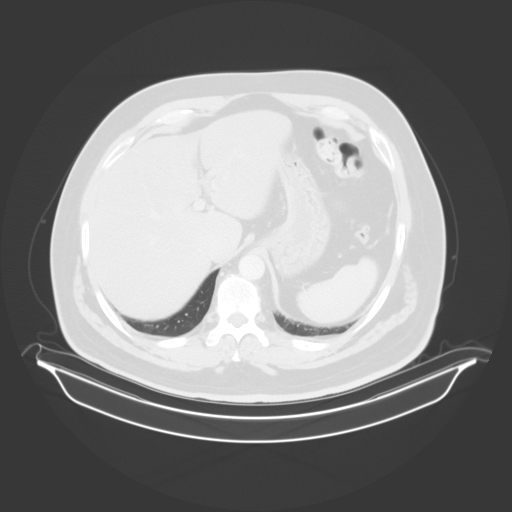
[im 13/63  lung]
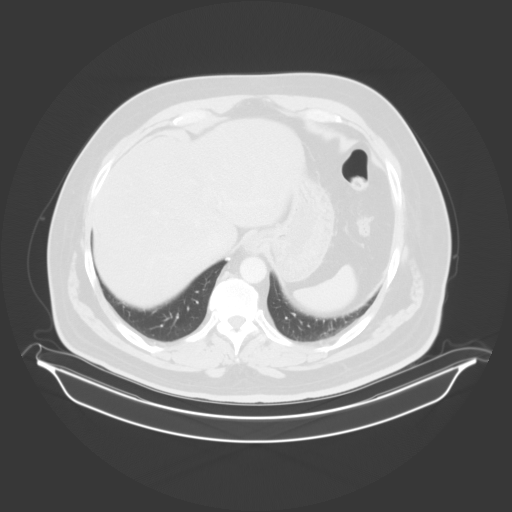
[im 17/63  lung]
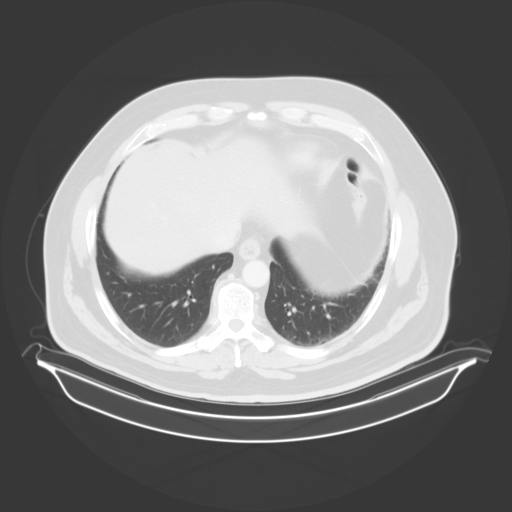
[im 21/63  mediastinal]
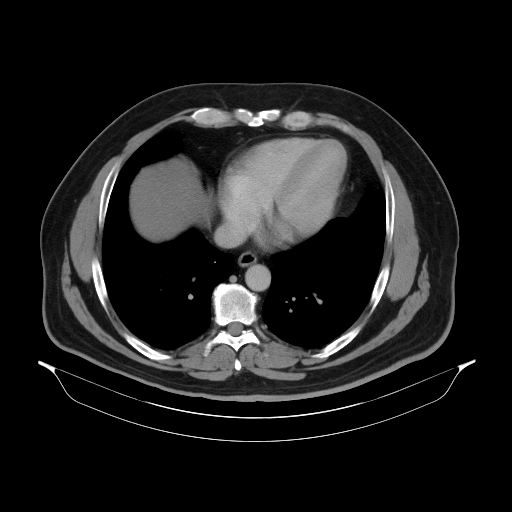
[im 21/63  lung]
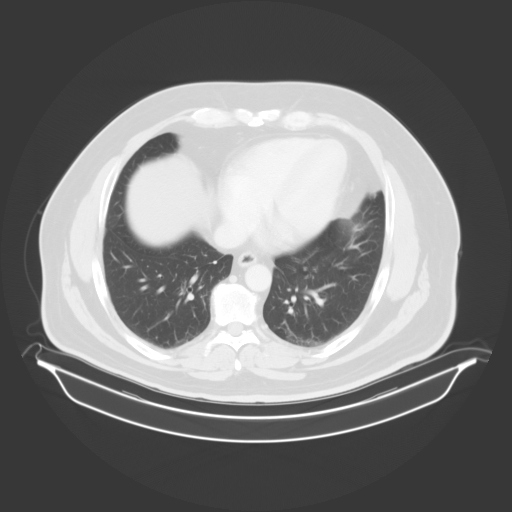
[im 25/63  lung]
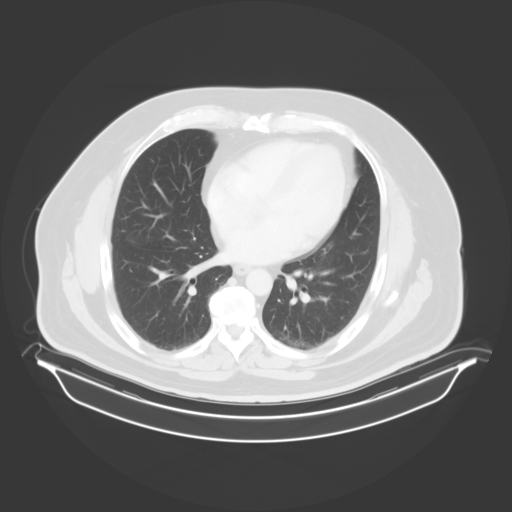
[im 28/63  lung]
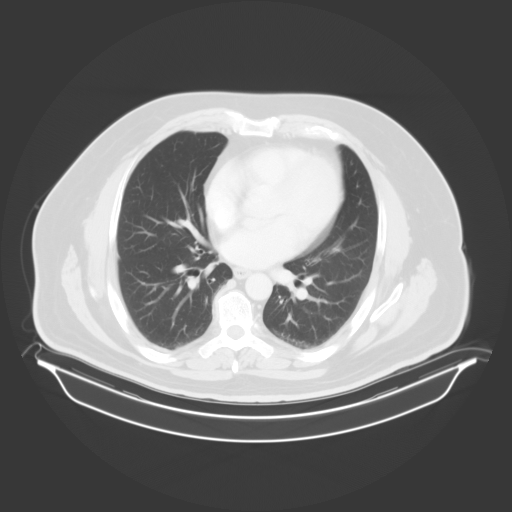
[im 33/63  lung]
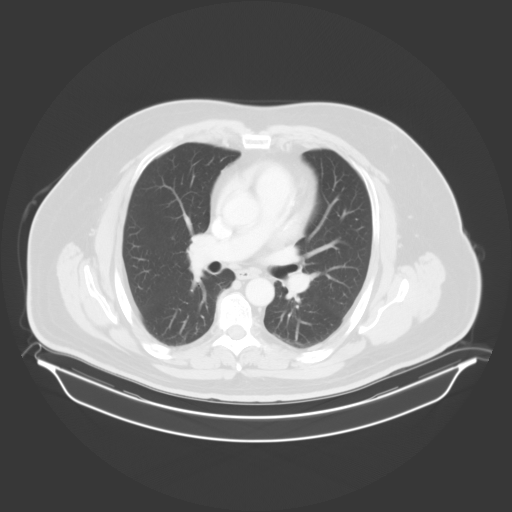
[im 35/63  mediastinal]
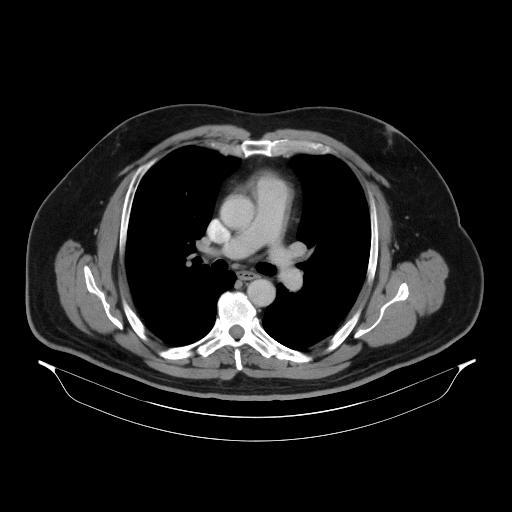
[im 35/63  lung]
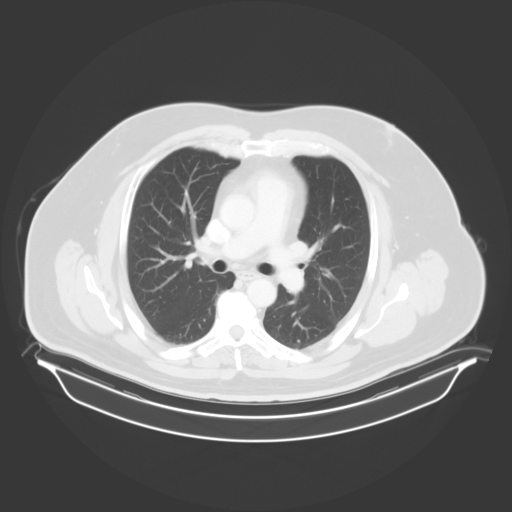
[im 38/63  lung]
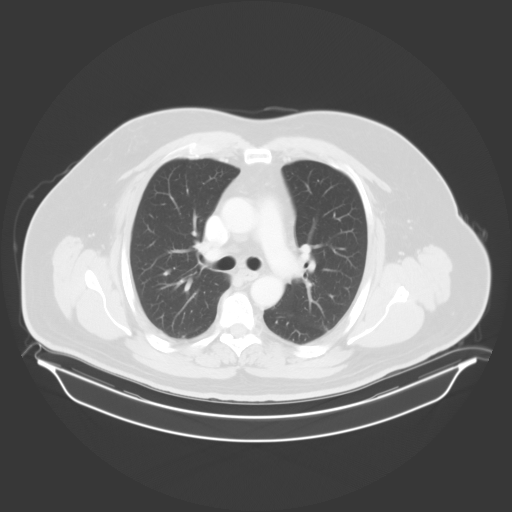
[im 42/63  lung]
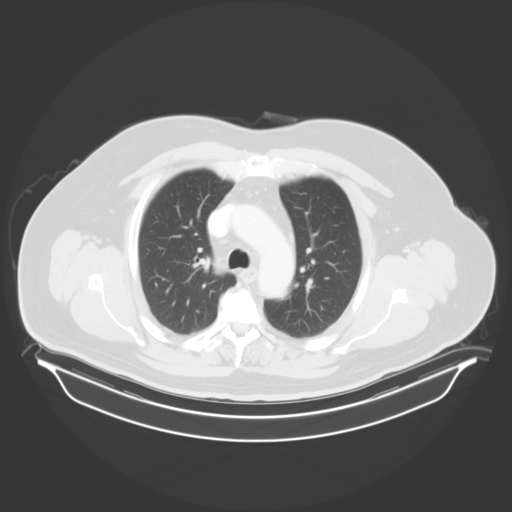
[im 46/63  lung]
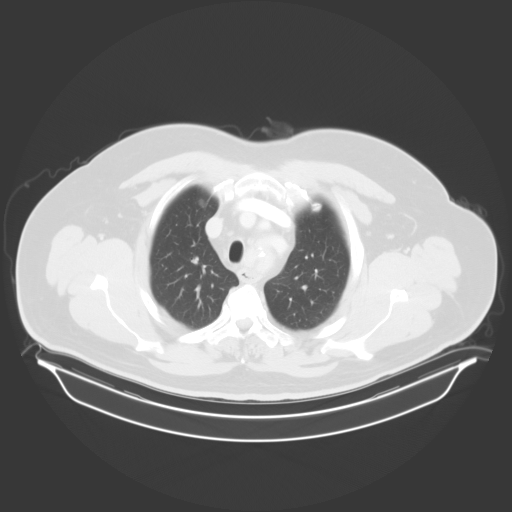
[im 50/63  mediastinal]
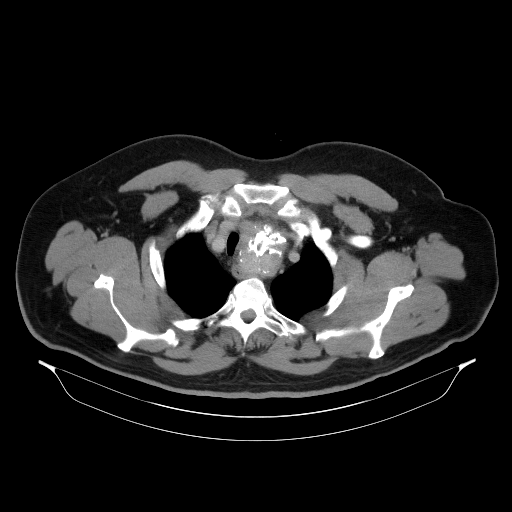
[im 50/63  lung]
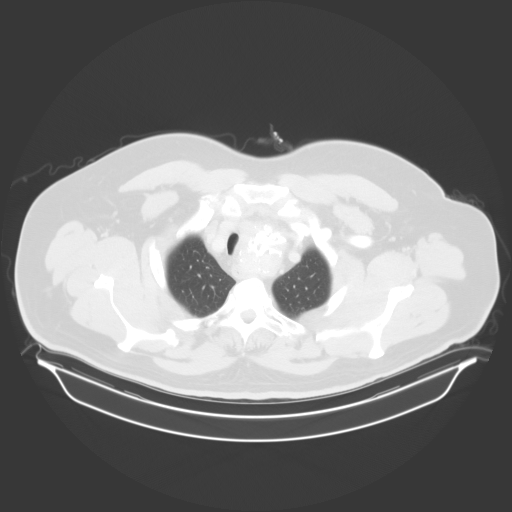
[im 53/63  lung]
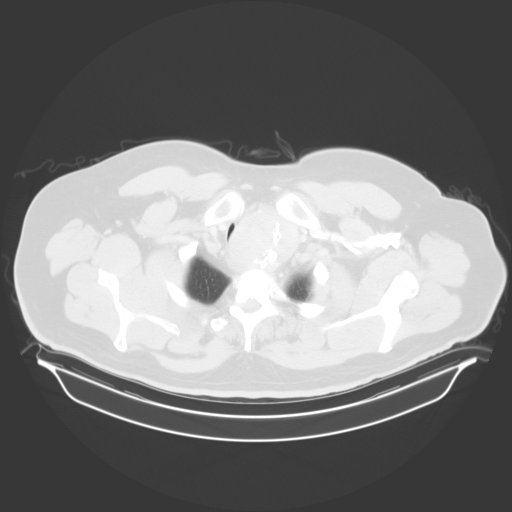
[im 58/63  lung]
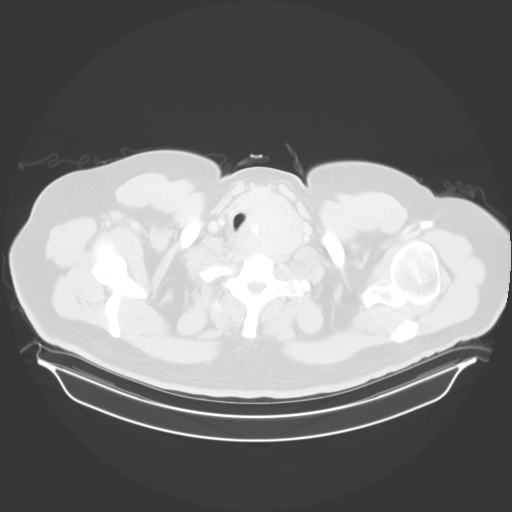

[15 of 33 positions shown; findings below may reference images not displayed]

FINDINGS: CT NECK FINDINGS

Pharynx is normal with no mucosal lesions. Larynx is normal. Major
salivary glands are symmetric and normal bilaterally. No significant
abnormality involving either carotid artery, with minimal focus of
calcification on the left. No significant cervical adenopathy.

Known large left thyroid mass measuring 6.8 x 5.1 by 9.5 cm with
heterogeneous enhancement and extensive calcification. Mild
nodularity and heterogeneity of the isthmus and right thyroid.

Esophagus and thyroid both significantly deviated to the right and
narrowed due to mass effect from the left thyroid mass.

CT CHEST FINDINGS

Enlarged heterogeneous thyroid mass on the left extends well into
the upper mediastinum, terminating posterior to the aortic arch. No
significant hilar or mediastinal adenopathy. Largest lymph node
measures 9 mm in the precarinal region and maintains a normal hilum.

Normal heart size with mild coronary calcification. No pleural or
pericardial effusion. No significant pulmonary parenchymal
abnormalities. No acute musculoskeletal findings.

Images through the upper abdomen demonstrate moderate diffuse
hepatic steatosis. The liver is not completely imaged. The adrenal
glands are not completely imaged but to the degree that the arc
imaged appear normal. Very small hiatal hernia noted.
IMPRESSION: Large left thyroid mass as described above causing significant mass
effect on the trachea and esophagus with right word deviation and
narrowing. Thyroid mass is suspicious for carcinoma and is status
post biopsy.

## 2017-09-03 DIAGNOSIS — D126 Benign neoplasm of colon, unspecified: Secondary | ICD-10-CM | POA: Diagnosis not present

## 2017-09-03 DIAGNOSIS — Z1211 Encounter for screening for malignant neoplasm of colon: Secondary | ICD-10-CM | POA: Diagnosis not present

## 2018-02-15 DIAGNOSIS — M5416 Radiculopathy, lumbar region: Secondary | ICD-10-CM | POA: Diagnosis not present

## 2018-02-15 DIAGNOSIS — R361 Hematospermia: Secondary | ICD-10-CM | POA: Diagnosis not present

## 2018-08-17 DIAGNOSIS — J111 Influenza due to unidentified influenza virus with other respiratory manifestations: Secondary | ICD-10-CM | POA: Diagnosis not present

## 2019-01-13 ENCOUNTER — Emergency Department (HOSPITAL_COMMUNITY): Payer: Commercial Managed Care - PPO

## 2019-01-13 ENCOUNTER — Encounter (HOSPITAL_COMMUNITY): Payer: Self-pay | Admitting: *Deleted

## 2019-01-13 ENCOUNTER — Emergency Department (HOSPITAL_COMMUNITY)
Admission: EM | Admit: 2019-01-13 | Discharge: 2019-01-13 | Disposition: A | Discharge status: Home / Self Care | Payer: Commercial Managed Care - PPO | Attending: Emergency Medicine | Admitting: Emergency Medicine

## 2019-01-13 ENCOUNTER — Other Ambulatory Visit: Payer: Self-pay

## 2019-01-13 DIAGNOSIS — E039 Hypothyroidism, unspecified: Secondary | ICD-10-CM | POA: Insufficient documentation

## 2019-01-13 DIAGNOSIS — Z7982 Long term (current) use of aspirin: Secondary | ICD-10-CM | POA: Insufficient documentation

## 2019-01-13 DIAGNOSIS — Z79899 Other long term (current) drug therapy: Secondary | ICD-10-CM | POA: Diagnosis not present

## 2019-01-13 DIAGNOSIS — R0789 Other chest pain: Secondary | ICD-10-CM | POA: Diagnosis not present

## 2019-01-13 LAB — BASIC METABOLIC PANEL
Anion gap: 11 (ref 5–15)
BUN: 10 mg/dL (ref 6–20)
CO2: 27 mmol/L (ref 22–32)
Calcium: 9.8 mg/dL (ref 8.9–10.3)
Chloride: 104 mmol/L (ref 98–111)
Creatinine, Ser: 0.96 mg/dL (ref 0.61–1.24)
GFR calc Af Amer: >60 mL/min (ref >60)
GFR calc non Af Amer: >60 mL/min (ref >60)
Glucose, Bld: 105 mg/dL — ABNORMAL HIGH (ref 70–99)
Potassium: 3.5 mmol/L (ref 3.5–5.1)
Sodium: 142 mmol/L (ref 135–145)

## 2019-01-13 LAB — CBC
HCT: 46.8 % (ref 39.0–52.0)
Hemoglobin: 15.6 g/dL (ref 13.0–17.0)
MCH: 29.6 pg (ref 26.0–34.0)
MCHC: 33.3 g/dL (ref 30.0–36.0)
MCV: 88.8 fL (ref 80.0–100.0)
Platelets: 246 10*3/uL (ref 150–400)
RBC: 5.27 MIL/uL (ref 4.22–5.81)
RDW: 12.7 % (ref 11.5–15.5)
WBC: 4.7 10*3/uL (ref 4.0–10.5)
nRBC: 0 % (ref 0.0–0.2)

## 2019-01-13 LAB — TROPONIN I (HIGH SENSITIVITY)
Troponin I (High Sensitivity): <2 ng/L (ref <18)
Troponin I (High Sensitivity): 2 ng/L (ref <18)

## 2019-01-13 MED ORDER — KETOROLAC TROMETHAMINE 30 MG/ML IJ SOLN
15.0000 mg | Freq: Once | INTRAMUSCULAR | Status: AC
  Administered 2019-01-13: 15 mg via INTRAMUSCULAR
  Filled 2019-01-13 (×2): qty 1

## 2019-01-13 MED ORDER — SODIUM CHLORIDE 0.9% FLUSH: 3.0000 mL | Freq: Once | INTRAVENOUS | Status: DC

## 2019-01-13 NOTE — ED Notes (Signed)
Pt ambulatory to room with no reported issues.

## 2019-01-13 NOTE — ED Provider Notes (Signed)
[facility] EMERGENCY DEPARTMENT Provider Note   CSN: [id number] Arrival date & time: 01/13/19  1511    History   Chief Complaint Chief Complaint  Patient presents with  . Chest Pain    HPI Wayne Delacruz is a 53 y.o. male.     HPI Presents with concern of chest pain. Pain is in the lower sternal and medial subcostal region, described as sore, worse with twisting, moving. No nausea, vomiting, fever, chills. No dyspnea. Patient has history of hypercholesterolemia, hypertension, takes simvastatin and hydrochlorothiazide regularly. No clear precipitant for this episode. He has no history of heart disease personally but does have a family history of such. Past Medical History:  Diagnosis Date  . Hyperlipidemia   . Hypothyroidism   . Pneumonia 05/2015  . Thyroid mass     Patient Active Problem List   Diagnosis Date Noted  . Substernal thyroid goiter 08/20/2015  . Neoplasm of uncertain behavior of thyroid gland 08/20/2015    Past Surgical History:  Procedure Laterality Date  . BACK SURGERY    . FINE NEEDLE ASPIRATION  ~ 07/2015   thyroid  . [place] SURGERY  1985  . THYROIDECTOMY N/A 08/21/2015   Procedure: TOTAL THYROIDECTOMY;  Surgeon: Armandina Gemma, MD;  Location: [facility];  Service: General;  Laterality: N/A;  . TOTAL THYROIDECTOMY  08/21/2015  . WISDOM TOOTH EXTRACTION  1980s        Home Medications    Prior to Admission medications   Medication Sig Start Date End Date Taking? Authorizing Provider  aspirin 81 MG tablet Take 81 mg by mouth daily.    [provider]  calcium carbonate (OS-CAL - DOSED IN MG OF ELEMENTAL CALCIUM) 1250 (500 Ca) MG tablet Take 2 tablets (1,000 mg of elemental calcium total) by mouth 2 (two) times daily with a meal. 08/22/15   Armandina Gemma, MD  HYDROcodone-acetaminophen (NORCO/VICODIN) 5-325 MG tablet Take 1-2 tablets by mouth every 4 (four) hours as needed for moderate pain. 08/22/15   Armandina Gemma, MD   Multiple Vitamin (MULTIVITAMIN WITH MINERALS) TABS tablet Take 1 tablet by mouth daily.    [provider]  simvastatin (ZOCOR) 40 MG tablet Take 40 mg by mouth daily. 07/24/15   [provider]  SYNTHROID 100 MCG tablet Take 1 tablet (100 mcg total) by mouth daily. 08/22/15   Armandina Gemma, MD    Family History No family history on file.  Social History Social History   Tobacco Use  . Smoking status: Never Smoker  . Smokeless tobacco: Never Used  Substance Use Topics  . Alcohol use: Yes    Alcohol/week: 1.0 standard drinks    Types: 1 Glasses of wine per week  . Drug use: No     Allergies   Amoxicillin and Augmentin [amoxicillin-pot clavulanate]   Review of Systems Review of Systems  Constitutional:       Per HPI, otherwise negative  HENT:       Per HPI, otherwise negative  Respiratory:       Per HPI, otherwise negative  Cardiovascular:       Per HPI, otherwise negative  Gastrointestinal: Negative for vomiting.  Endocrine:       Negative aside from HPI  Genitourinary:       Neg aside from HPI   Musculoskeletal:       Per HPI, otherwise negative  Skin: Negative.   Neurological: Negative for syncope.     Physical Exam Updated Vital Signs BP Marland Kitchen)  134/99   Pulse 73   Temp 98.5 F (36.9 C) (Oral)   Resp 18   SpO2 100%   Physical Exam Vitals signs and nursing note reviewed.  Constitutional:      General: He is not in acute distress.    Appearance: He is well-developed.  HENT:     Head: Normocephalic and atraumatic.  Eyes:     Conjunctiva/sclera: Conjunctivae normal.  Cardiovascular:     Rate and Rhythm: Normal rate and regular rhythm.  Pulmonary:     Effort: Pulmonary effort is normal. No respiratory distress.     Breath sounds: No stridor.  Chest:     Comments: ttp about the lower chest wall Abdominal:     General: There is no distension.  Skin:    General: Skin is warm and dry.  Neurological:     Mental Status: He is alert and  oriented to person, place, and time.      ED Treatments / Results  Labs (all labs ordered are listed, but only abnormal results are displayed) Labs Reviewed  BASIC METABOLIC PANEL - Abnormal; Notable for the following components:      Result Value   Glucose, Bld 105 (*)    All other components within normal limits  CBC  TROPONIN I (HIGH SENSITIVITY)  TROPONIN I (HIGH SENSITIVITY)    EKG EKG Interpretation  Date/Time:  Thursday January 13 2019 15:20:35 EDT Ventricular Rate:  79 PR Interval:  136 QRS Duration: 86 QT Interval:  356 QTC Calculation: 408 R Axis:   24 Text Interpretation:  Normal sinus rhythm ST-t wave abnormality Baseline wander Artifact Abnormal ECG Confirmed by Carmin Muskrat [phone]) on 01/13/2019 5:22:40 PM   Radiology Dg Chest 2 View  Result Date: 01/13/2019 CLINICAL DATA:  53 year old male with chest pain and pressure. EXAM: CHEST - 2 VIEW COMPARISON:  CT chest 07/20/2015 and earlier. FINDINGS: Interval left thyroidectomy. Visualized tracheal air column is within normal limits. Lung volumes and mediastinal contours are within normal limits. Both lungs appear stable since 2016 and clear. No pneumothorax or pleural effusion. Negative visible bowel gas pattern. No acute osseous abnormality identified. IMPRESSION: No acute cardiopulmonary abnormality. Electronically Signed   By: Genevie Ann M.D.   On: 01/13/2019 16:14    Procedures Procedures (including critical care time)  Medications Ordered in ED Medications  sodium chloride flush (NS) 0.9 % injection 3 mL (has no administration in time range)  ketorolac (TORADOL) 30 MG/ML injection 15 mg (15 mg Intramuscular Given 01/13/19 1925)     Initial Impression / Assessment and Plan / ED Course  I have reviewed the triage vital signs and the nursing notes.  Pertinent labs & imaging results that were available during my care of the patient were reviewed by me and considered in my medical decision making (see chart for  details).        7:37 PM On repeat exam the patient is awake, alert, in no distress. Second troponin is unremarkable as well as the first, EKG is nonischemic, chest x-ray unremarkable as well. With reassuring labs, physical exam, x-ray, little evidence for atypical ACS, no evidence for PE, dissection. Patient appropriate for discharge with close outpatient follow-up, with likely inflammatory disorder of the chest wall.  Final Clinical Impressions(s) / ED Diagnoses  Atypical chest pain   Carmin Muskrat, MD 01/13/19 [phone]

## 2019-01-13 NOTE — Discharge Instructions (Addendum)
As discussed, your evaluation today has been largely reassuring.  But, it is important that you monitor your condition carefully, and do not hesitate to return to the ED if you develop new, or concerning changes in your condition.  For the next 5 days please use Naprosyn, 500 mg, twice daily.  Otherwise, please follow-up with your physician for appropriate ongoing care.

## 2019-01-13 NOTE — ED Triage Notes (Signed)
Pt reports generalized chest pain and pressure. Pt denies cough, congestion fevers.

## 2019-01-14 ENCOUNTER — Telehealth: Payer: Self-pay | Admitting: *Deleted

## 2019-01-14 NOTE — Telephone Encounter (Signed)
Pt and wife called regarding Rx for naprosyn.  EDCM reviewed chart to find that Rx was not given related to naprosyn being an OTC drug. Explained to pt and he verbalized understanding.  No further EDCM needs identified at this time.

## 2019-09-29 ENCOUNTER — Ambulatory Visit: Payer: 59 | Attending: Internal Medicine

## 2019-09-29 DIAGNOSIS — Z23 Encounter for immunization: Secondary | ICD-10-CM

## 2019-09-29 NOTE — Progress Notes (Signed)
   Covid-19 Vaccination Clinic  Name:  Wayne Delacruz    MRN: [id number] DOB: 11/03/1965  09/29/2019  Wayne Delacruz was observed post Covid-19 immunization for 15 minutes without incident. He was provided with Vaccine Information Sheet and instruction to access the V-Safe system.   Wayne Delacruz was instructed to call 911 with any severe reactions post vaccine: Marland Kitchen Difficulty breathing  . Swelling of face and throat  . A fast heartbeat  . A bad rash all over body  . Dizziness and weakness   Immunizations Administered    Name Date Dose VIS Date Route   Pfizer COVID-19 Vaccine 09/29/2019  3:11 PM 0.3 mL 06/24/2019 Intramuscular   Manufacturer: [place]   Lot: [id number]   [place]: [id number]

## 2019-10-24 ENCOUNTER — Ambulatory Visit: Payer: 59 | Attending: Internal Medicine

## 2019-10-24 DIAGNOSIS — Z23 Encounter for immunization: Secondary | ICD-10-CM

## 2019-10-24 NOTE — Progress Notes (Signed)
   Covid-19 Vaccination Clinic  Name:  Wayne Delacruz    MRN: [id number] DOB: 1966-03-20  10/24/2019  Mr. Wayne Delacruz was observed post Covid-19 immunization for 15 minutes without incident. He was provided with Vaccine Information Sheet and instruction to access the V-Safe system.   Mr. Wayne Delacruz was instructed to call 911 with any severe reactions post vaccine: Marland Kitchen Difficulty breathing  . Swelling of face and throat  . A fast heartbeat  . A bad rash all over body  . Dizziness and weakness   Immunizations Administered    Name Date Dose VIS Date Route   Pfizer COVID-19 Vaccine 10/24/2019  3:41 PM 0.3 mL 06/24/2019 Intramuscular   Manufacturer: [employer], [employer]   Lot: [id number]   [place]: [id number]

## 2020-05-12 ENCOUNTER — Ambulatory Visit: Payer: 59 | Attending: Internal Medicine

## 2020-05-12 ENCOUNTER — Other Ambulatory Visit: Payer: Self-pay

## 2020-05-12 DIAGNOSIS — Z23 Encounter for immunization: Secondary | ICD-10-CM

## 2020-05-12 NOTE — Progress Notes (Signed)
   Covid-19 Vaccination Clinic  Name:  Wayne Delacruz    MRN: [id number] DOB: [birth year]  05/12/2020  [name] was observed post Covid-19 immunization for 15 minutes without incident. He was provided with Vaccine Information Sheet and instruction to access the V-Safe system.   [name] was instructed to call 911 with any severe reactions post vaccine: Marland Kitchen Difficulty breathing  . Swelling of face and throat  . A fast heartbeat  . A bad rash all over body  . Dizziness and weakness

## 2020-06-09 IMAGING — CR CHEST - 2 VIEW
2 series · 2 of 2 positions shown · non-contrast
Comparison: CT chest 07/20/2015 and earlier.

CLINICAL DATA: 52-year-old male with chest pain and pressure.

EXAM:
CHEST - 2 VIEW

[chest pa]
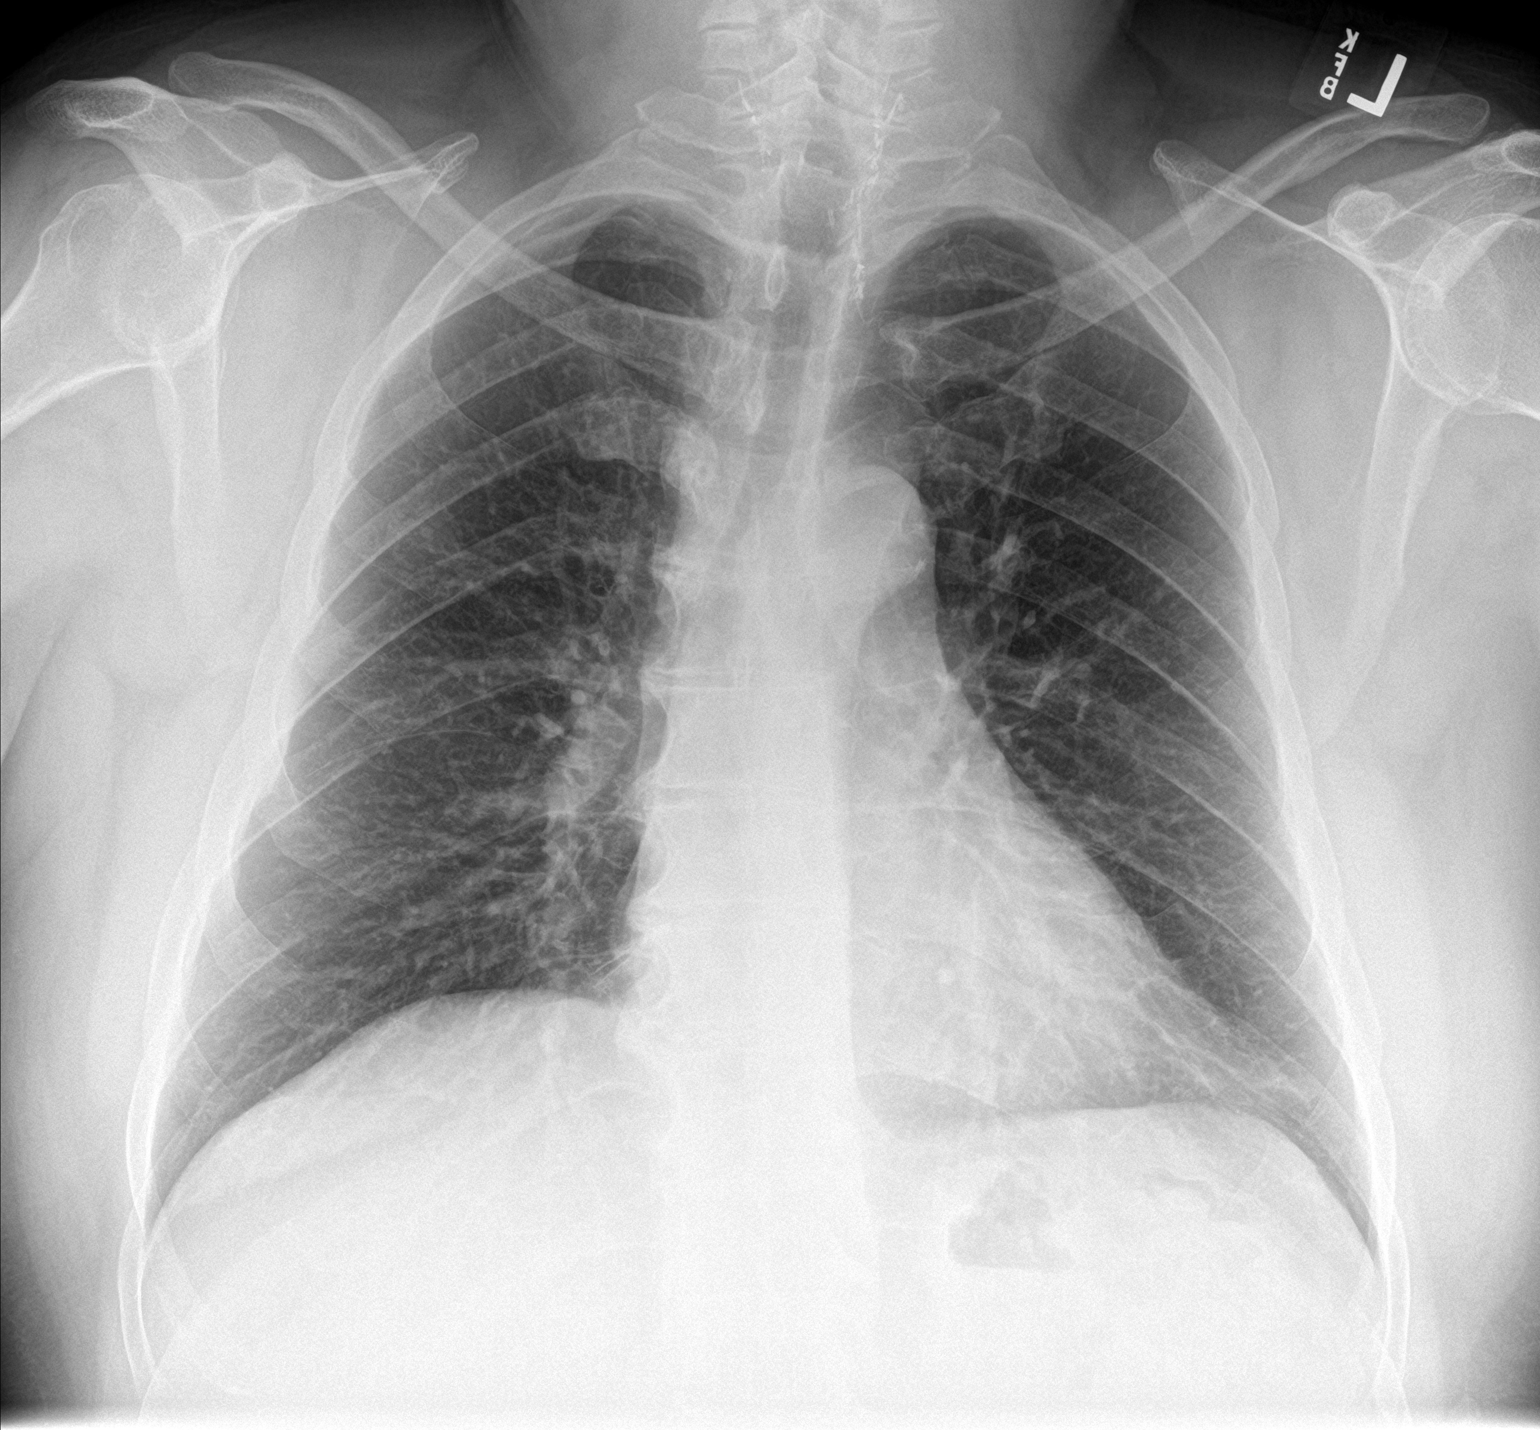

[chest lat]
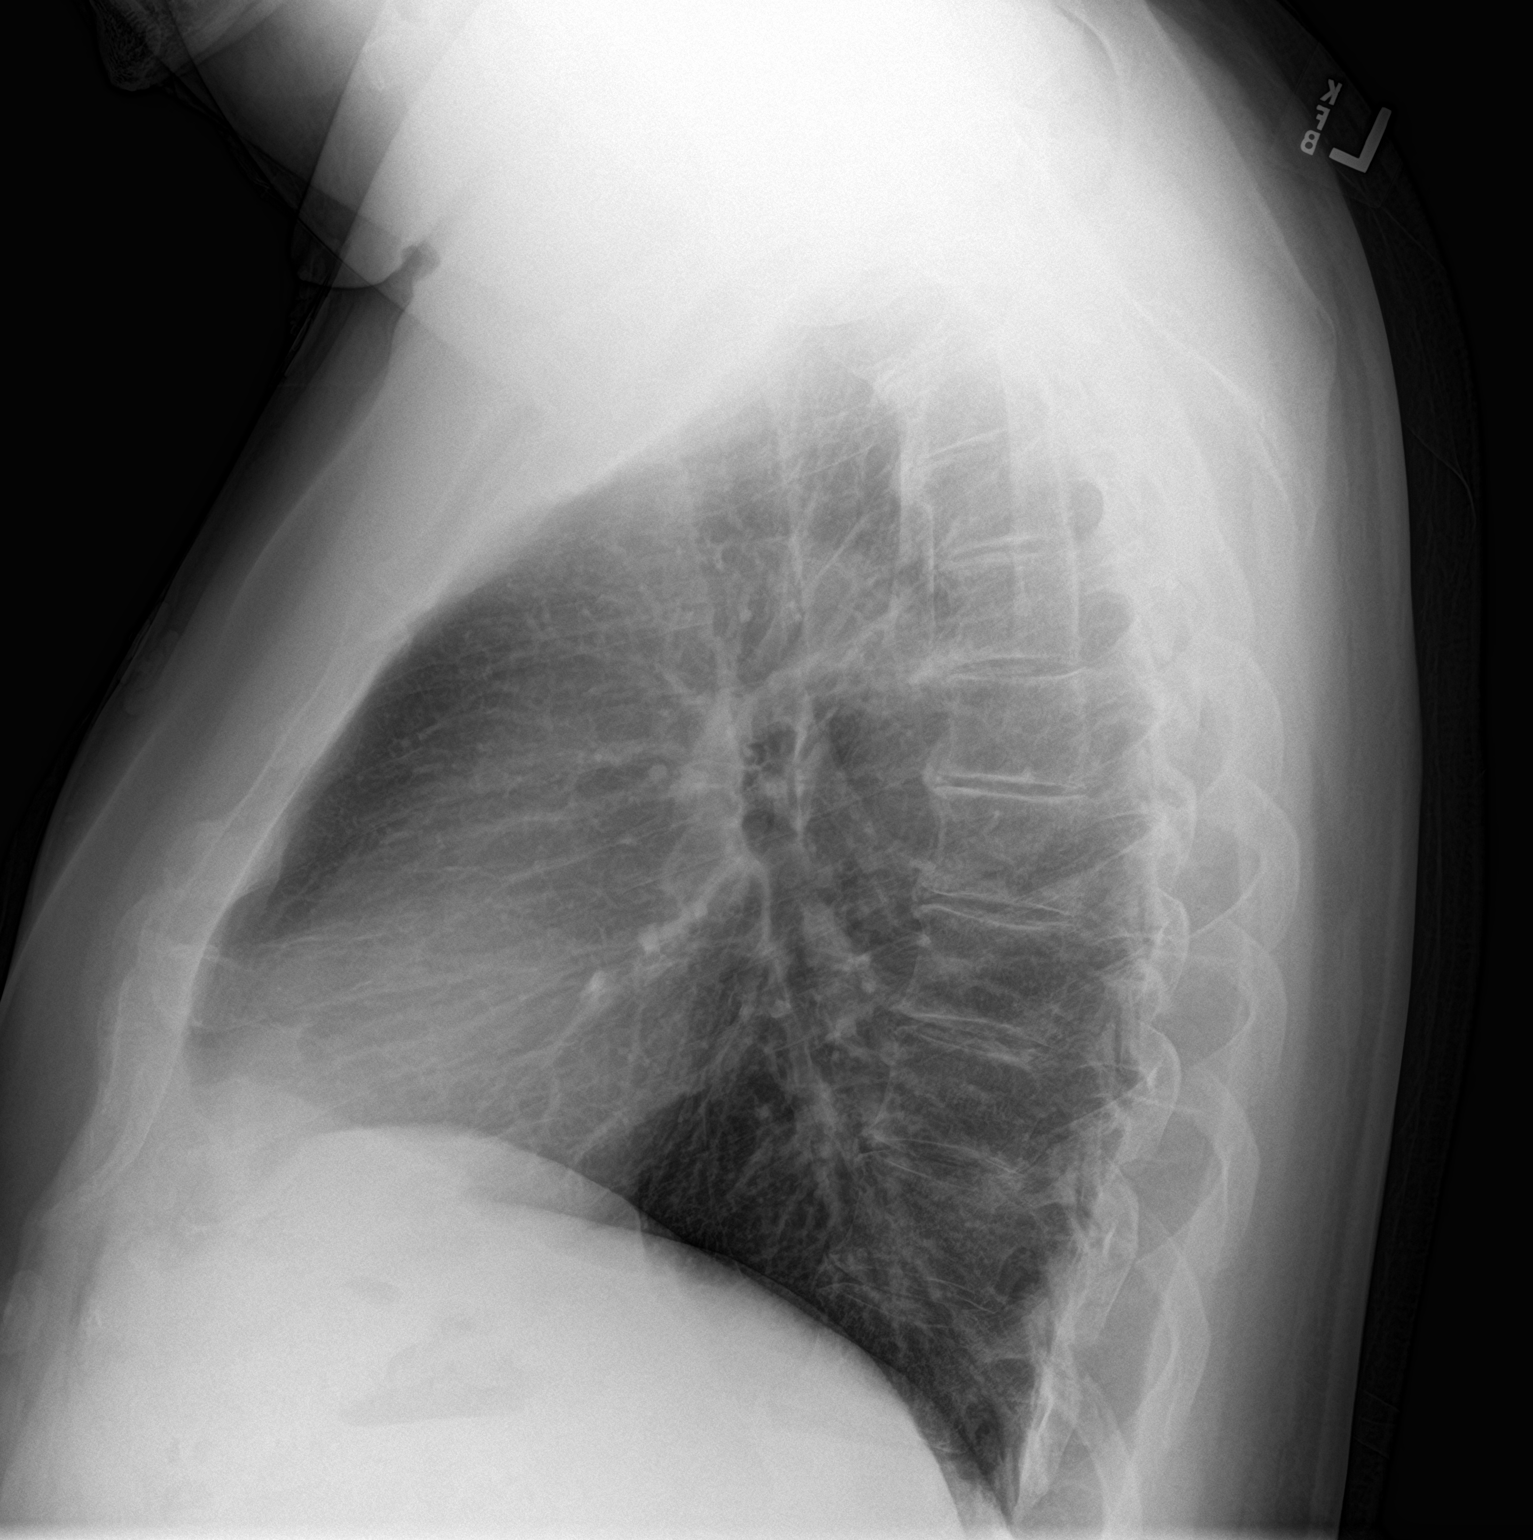

[2 of 2 positions shown; findings below may reference images not displayed]

FINDINGS: Interval left thyroidectomy. Visualized tracheal air column is
within normal limits.

Lung volumes and mediastinal contours are within normal limits. Both
lungs appear stable since 8222 and clear. No pneumothorax or pleural
effusion. Negative visible bowel gas pattern. No acute osseous
abnormality identified.
IMPRESSION: No acute cardiopulmonary abnormality.
